# Patient Record
Sex: Female | Born: 1980 | Race: White | Hispanic: No | Smoking: Former smoker
Health system: Southern US, Community
[De-identification: ages and names within clinical notes are randomized; demographics above are authoritative.]

## PROBLEM LIST (undated history)

## (undated) ENCOUNTER — Emergency Department: Disposition: A | Discharge status: Home / Self Care | Payer: Self-pay

## (undated) DIAGNOSIS — F329 Major depressive disorder, single episode, unspecified: Secondary | ICD-10-CM

## (undated) DIAGNOSIS — G43909 Migraine, unspecified, not intractable, without status migrainosus: Secondary | ICD-10-CM

## (undated) DIAGNOSIS — F419 Anxiety disorder, unspecified: Secondary | ICD-10-CM

## (undated) DIAGNOSIS — G4700 Insomnia, unspecified: Secondary | ICD-10-CM

## (undated) DIAGNOSIS — F32A Depression, unspecified: Secondary | ICD-10-CM

## (undated) HISTORY — DX: Migraine, unspecified, not intractable, without status migrainosus: G43.909

## (undated) HISTORY — PX: INCISE AND DRAIN ABCESS: PRO64

## (undated) HISTORY — DX: Anxiety disorder, unspecified: F41.9

---

## 2008-06-10 ENCOUNTER — Emergency Department (HOSPITAL_BASED_OUTPATIENT_CLINIC_OR_DEPARTMENT_OTHER): Admission: EM | Admit: 2008-06-10 | Discharge: 2008-06-10 | Payer: Self-pay | Admitting: Emergency Medicine

## 2008-12-27 ENCOUNTER — Emergency Department (HOSPITAL_BASED_OUTPATIENT_CLINIC_OR_DEPARTMENT_OTHER): Admission: EM | Admit: 2008-12-27 | Discharge: 2008-12-27 | Payer: Self-pay | Admitting: Emergency Medicine

## 2009-06-02 ENCOUNTER — Emergency Department (HOSPITAL_BASED_OUTPATIENT_CLINIC_OR_DEPARTMENT_OTHER): Admission: EM | Admit: 2009-06-02 | Discharge: 2009-06-02 | Payer: Self-pay | Admitting: Emergency Medicine

## 2009-09-28 ENCOUNTER — Emergency Department (HOSPITAL_BASED_OUTPATIENT_CLINIC_OR_DEPARTMENT_OTHER): Admission: EM | Admit: 2009-09-28 | Discharge: 2009-09-28 | Payer: Self-pay | Admitting: Emergency Medicine

## 2009-12-25 ENCOUNTER — Emergency Department (HOSPITAL_BASED_OUTPATIENT_CLINIC_OR_DEPARTMENT_OTHER): Admission: EM | Admit: 2009-12-25 | Discharge: 2009-12-25 | Payer: Self-pay | Admitting: Emergency Medicine

## 2010-01-18 ENCOUNTER — Emergency Department (HOSPITAL_BASED_OUTPATIENT_CLINIC_OR_DEPARTMENT_OTHER): Admission: EM | Admit: 2010-01-18 | Discharge: 2010-01-18 | Payer: Self-pay | Admitting: Emergency Medicine

## 2010-01-18 ENCOUNTER — Ambulatory Visit: Payer: Self-pay | Admitting: Diagnostic Radiology

## 2010-02-04 ENCOUNTER — Emergency Department (HOSPITAL_BASED_OUTPATIENT_CLINIC_OR_DEPARTMENT_OTHER): Admission: EM | Admit: 2010-02-04 | Discharge: 2010-02-04 | Payer: Self-pay | Admitting: Emergency Medicine

## 2011-03-20 LAB — CBC
HCT: 42.6 % (ref 36.0–46.0)
Hemoglobin: 14.5 g/dL (ref 12.0–15.0)
MCHC: 34 g/dL (ref 30.0–36.0)
MCV: 88.1 fL (ref 78.0–100.0)
Platelets: 233 10*3/uL (ref 150–400)
RBC: 4.84 MIL/uL (ref 3.87–5.11)

## 2011-03-20 LAB — DIFFERENTIAL
Basophils Relative: 1 % (ref 0–1)
Eosinophils Relative: 1 % (ref 0–5)
Lymphocytes Relative: 10 % — ABNORMAL LOW (ref 12–46)
Lymphs Abs: 1.5 10*3/uL (ref 0.7–4.0)
Monocytes Relative: 3 % (ref 3–12)
Neutro Abs: 12.6 10*3/uL — ABNORMAL HIGH (ref 1.7–7.7)
Neutrophils Relative %: 85 % — ABNORMAL HIGH (ref 43–77)

## 2011-03-20 LAB — URINE MICROSCOPIC-ADD ON

## 2011-03-20 LAB — URINE CULTURE

## 2011-03-20 LAB — COMPREHENSIVE METABOLIC PANEL
Alkaline Phosphatase: 95 U/L (ref 39–117)
BUN: 8 mg/dL (ref 6–23)
CO2: 23 mEq/L (ref 19–32)
Calcium: 9.6 mg/dL (ref 8.4–10.5)
Chloride: 104 mEq/L (ref 96–112)
GFR calc Af Amer: >60 mL/min (ref >60)
GFR calc non Af Amer: >60 mL/min (ref >60)
Glucose, Bld: 71 mg/dL (ref 70–99)
Potassium: 4.7 mEq/L (ref 3.5–5.1)
Sodium: 139 mEq/L (ref 135–145)
Total Bilirubin: 0.8 mg/dL (ref 0.3–1.2)
Total Protein: 8 g/dL (ref 6.0–8.3)

## 2011-03-20 LAB — URINALYSIS, ROUTINE W REFLEX MICROSCOPIC
Bilirubin Urine: NEGATIVE
Urobilinogen, UA: 0.2 mg/dL (ref 0.0–1.0)

## 2011-03-20 LAB — POCT CARDIAC MARKERS: Myoglobin, poc: 32 ng/mL (ref 12–200)

## 2011-03-30 ENCOUNTER — Inpatient Hospital Stay (INDEPENDENT_AMBULATORY_CARE_PROVIDER_SITE_OTHER)
Admission: RE | Admit: 2011-03-30 | Discharge: 2011-03-30 | Disposition: A | Discharge status: Home / Self Care | Payer: BC Managed Care – PPO | Source: Ambulatory Visit | Attending: Family Medicine | Admitting: Family Medicine

## 2011-03-30 ENCOUNTER — Encounter: Payer: Self-pay | Admitting: Family Medicine

## 2011-03-30 ENCOUNTER — Ambulatory Visit
Admission: RE | Admit: 2011-03-30 | Discharge: 2011-03-30 | Disposition: A | Discharge status: Home / Self Care | Payer: BC Managed Care – PPO | Source: Ambulatory Visit | Attending: Family Medicine | Admitting: Family Medicine

## 2011-03-30 ENCOUNTER — Other Ambulatory Visit: Payer: Self-pay | Admitting: Family Medicine

## 2011-03-30 DIAGNOSIS — S8992XA Unspecified injury of left lower leg, initial encounter: Secondary | ICD-10-CM

## 2011-03-30 DIAGNOSIS — S82839A Other fracture of upper and lower end of unspecified fibula, initial encounter for closed fracture: Secondary | ICD-10-CM

## 2011-03-30 DIAGNOSIS — S99929A Unspecified injury of unspecified foot, initial encounter: Secondary | ICD-10-CM

## 2011-03-30 DIAGNOSIS — I10 Essential (primary) hypertension: Secondary | ICD-10-CM | POA: Insufficient documentation

## 2011-03-30 DIAGNOSIS — M25562 Pain in left knee: Secondary | ICD-10-CM

## 2011-03-30 DIAGNOSIS — S8990XA Unspecified injury of unspecified lower leg, initial encounter: Secondary | ICD-10-CM

## 2011-03-30 DIAGNOSIS — M79609 Pain in unspecified limb: Secondary | ICD-10-CM

## 2011-03-30 DIAGNOSIS — F411 Generalized anxiety disorder: Secondary | ICD-10-CM | POA: Insufficient documentation

## 2011-03-31 ENCOUNTER — Other Ambulatory Visit: Payer: BC Managed Care – PPO

## 2011-03-31 ENCOUNTER — Telehealth (INDEPENDENT_AMBULATORY_CARE_PROVIDER_SITE_OTHER): Payer: Self-pay | Admitting: Emergency Medicine

## 2011-04-02 ENCOUNTER — Telehealth (INDEPENDENT_AMBULATORY_CARE_PROVIDER_SITE_OTHER): Payer: Self-pay

## 2011-04-03 ENCOUNTER — Other Ambulatory Visit: Payer: Self-pay | Admitting: Orthopedic Surgery

## 2011-04-03 DIAGNOSIS — M25562 Pain in left knee: Secondary | ICD-10-CM

## 2011-04-08 ENCOUNTER — Other Ambulatory Visit: Payer: BC Managed Care – PPO

## 2011-04-12 ENCOUNTER — Ambulatory Visit
Admission: RE | Admit: 2011-04-12 | Discharge: 2011-04-12 | Disposition: A | Discharge status: Home / Self Care | Payer: BC Managed Care – PPO | Source: Ambulatory Visit | Attending: Orthopedic Surgery | Admitting: Orthopedic Surgery

## 2011-04-12 DIAGNOSIS — M25562 Pain in left knee: Secondary | ICD-10-CM
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
## 2011-11-13 NOTE — Telephone Encounter (Signed)
  Phone Note Outgoing Call   Call placed by: Linton Flemings RN,  April 02, 2011 3:35 PM Call placed to: Patient Summary of Call: Called [facility] pharmacy for order for Lortab 5/500mg  q4-6 hrs as needed for pain, dispense #5, pt made aware and instructed to follow up with ortho on Monday Initial call taken by: Linton Flemings RN,  April 02, 2011 3:36 PM

## 2011-11-13 NOTE — Telephone Encounter (Signed)
  Phone Note Outgoing Call   Call placed by: Linton Flemings RN,  April 02, 2011 2:38 PM Call placed to: Insurer Summary of Call: returned called made from pt.  pt was requesting more pain medication/ Pt is now being seen by orthopedist, informed Dr. Georgina Pillion and instructed pt to call ortho. on call for pain medication. Initial call taken by: Linton Flemings RN,  April 02, 2011 2:40 PM

## 2011-11-13 NOTE — Telephone Encounter (Signed)
  Phone Note Outgoing Call   Call placed by: Lavell Islam RN,  March 31, 2011 9:06 AM Call placed to: Patient Action Taken: Phone Call Completed Summary of Call: Left message on answering machine to call and let us know how appt. with ortho went, and update [place] on how she is doing. Initial call taken by: Lavell Islam RN,  March 31, 2011 9:07 AM

## 2012-01-20 ENCOUNTER — Emergency Department
Admission: EM | Admit: 2012-01-20 | Discharge: 2012-01-20 | Disposition: A | Discharge status: Home / Self Care | Payer: BC Managed Care – PPO | Source: Home / Self Care | Attending: Emergency Medicine | Admitting: Emergency Medicine

## 2012-01-20 DIAGNOSIS — J069 Acute upper respiratory infection, unspecified: Secondary | ICD-10-CM

## 2012-01-20 DIAGNOSIS — J329 Chronic sinusitis, unspecified: Secondary | ICD-10-CM

## 2012-01-20 HISTORY — DX: Depression, unspecified: F32.A

## 2012-01-20 HISTORY — DX: Major depressive disorder, single episode, unspecified: F32.9

## 2012-01-20 MED ORDER — DOXYCYCLINE HYCLATE 100 MG PO CAPS: 100.0000 mg | ORAL_CAPSULE | Freq: Two times a day (BID) | ORAL | Status: AC

## 2012-01-20 NOTE — ED Provider Notes (Addendum)
History     CSN: [id number]  Arrival date & time 01/20/12  1233   First MD Initiated Contact with Patient 01/20/12 1239      No chief complaint on file.   (Consider location/radiation/quality/duration/timing/severity/associated sxs/prior treatment) HPI Suzanne is a 31 y.o. female who complains of onset of cold symptoms for 5-6 days.  Tried Maxalt b/c thought it was a migraine, but didn't help. No sore throat No cough No pleuritic pain No wheezing No nasal congestion No post-nasal drainage + sinus pain/pressure No chest congestion No itchy/red eyes No earache No hemoptysis No SOB No chills/sweats No fever No nausea No vomiting No abdominal pain No diarrhea No skin rashes No fatigue No myalgias + headache    No past medical history on file.  No past surgical history on file.  No family history on file.  History  Substance Use Topics  . Smoking status: Not on file  . Smokeless tobacco: Not on file  . Alcohol Use: Not on file    OB History    No data available      Review of Systems  Allergies  Penicillins  Home Medications  No current outpatient prescriptions on file.  There were no vitals taken for this visit.  Physical Exam  Nursing note and vitals reviewed. Constitutional: She is oriented to person, place, and time. She appears well-developed and well-nourished.  HENT:  Head: Normocephalic and atraumatic.  Right Ear: Tympanic membrane, external ear and ear canal normal.  Left Ear: Tympanic membrane, external ear and ear canal normal.  Nose: Mucosal edema and rhinorrhea present. Right sinus exhibits maxillary sinus tenderness. Left sinus exhibits maxillary sinus tenderness.  Mouth/Throat: Posterior oropharyngeal erythema present. No oropharyngeal exudate or posterior oropharyngeal edema.  Eyes: No scleral icterus.  Neck: Neck supple.  Cardiovascular: Regular rhythm and normal heart sounds.   Pulmonary/Chest: Effort normal and breath sounds  normal. No respiratory distress.  Neurological: She is alert and oriented to person, place, and time.  Skin: Skin is warm and dry.  Psychiatric: She has a normal mood and affect. Her speech is normal.    ED Course  Procedures (including critical care time)  Labs Reviewed - No data to display No results found.   No diagnosis found.    MDM  1)  Take the prescribed antibiotic as instructed. 2)  Use nasal saline solution (over the counter) at least 3 times a day. 3)  Use over the counter decongestants like Zyrtec-D every 12 hours as needed to help with congestion.  If you have hypertension, do not take medicines with sudafed.  4)  Can take tylenol every 6 hours or motrin every 8 hours for pain or fever. 5)  Follow up with your primary doctor if no improvement in 5-7 days, sooner if increasing pain, fever, or new symptoms.     Lily Kocher, MD 01/20/12 1257  Lily Kocher, MD 01/20/12 1257

## 2012-09-07 ENCOUNTER — Emergency Department
Admission: EM | Admit: 2012-09-07 | Discharge: 2012-09-07 | Disposition: A | Discharge status: Home / Self Care | Payer: BC Managed Care – PPO | Source: Home / Self Care

## 2012-09-07 DIAGNOSIS — S058X9A Other injuries of unspecified eye and orbit, initial encounter: Secondary | ICD-10-CM

## 2012-09-07 DIAGNOSIS — S0502XA Injury of conjunctiva and corneal abrasion without foreign body, left eye, initial encounter: Secondary | ICD-10-CM

## 2012-09-07 DIAGNOSIS — G4700 Insomnia, unspecified: Secondary | ICD-10-CM | POA: Insufficient documentation

## 2012-09-07 HISTORY — DX: Insomnia, unspecified: G47.00

## 2012-09-07 MED ORDER — POLYMYXIN B-TRIMETHOPRIM 10000-0.1 UNIT/ML-% OP SOLN: 1.0000 [drp] | OPHTHALMIC | Status: DC

## 2012-09-07 MED ORDER — KETOROLAC TROMETHAMINE 0.4 % OP SOLN: 1.0000 [drp] | Freq: Four times a day (QID) | OPHTHALMIC | Status: DC

## 2012-09-07 MED ORDER — HYDROCODONE-ACETAMINOPHEN 5-500 MG PO TABS: 1.0000 | ORAL_TABLET | Freq: Four times a day (QID) | ORAL | Status: DC | PRN

## 2012-09-07 NOTE — ED Provider Notes (Signed)
History     CSN: [id number]  Arrival date & time 09/07/12  1135   None     Chief Complaint  Patient presents with  . Eye Pain    x this am    (Consider location/radiation/quality/duration/timing/severity/associated sxs/prior treatment) HPI Comments: Patient awoke this morning with a foreign body sensation in her left eye that has persisted.  She recalls no trauma to her eye.  She does not wear contacts.  Patient is a 31 y.o. female presenting with eye pain. The history is provided by the patient.  Eye Pain This is a new problem. The current episode started 3 to 5 hours ago. The problem occurs constantly. The problem has not changed since onset.Associated symptoms comments: none. Exacerbated by: blinking. Nothing relieves the symptoms. She has tried nothing for the symptoms.    Past Medical History  Diagnosis Date  . Depression   . Insomnia     History reviewed. No pertinent past surgical history.  Family History  Problem Relation Age of Onset  . Hyperlipidemia Mother   . Hypertension Mother     History  Substance Use Topics  . Smoking status: Current Every Day Smoker -- 1.0 packs/day for 15 years    Types: Cigarettes  . Smokeless tobacco: Never Used  . Alcohol Use: Yes    OB History    Grav Para Term Preterm Abortions TAB SAB Ect Mult Living                  Review of Systems  Eyes: Positive for pain.  All other systems reviewed and are negative.    Allergies  Penicillins  Home Medications   Current Outpatient Rx  Name Route Sig Dispense Refill  . ZOLPIDEM TARTRATE 10 MG PO TABS Oral Take 10 mg by mouth at bedtime as needed.    Marland Kitchen HYDROCODONE-ACETAMINOPHEN 5-500 MG PO TABS Oral Take 1 tablet by mouth every 6 (six) hours as needed for pain. 12 tablet 0  . KETOROLAC TROMETHAMINE 0.4 % OP SOLN Left Eye Place 1 drop into the left eye 4 (four) times daily. 5 mL 0  . SERTRALINE HCL 20 MG/ML PO CONC Oral Take 25 mg by mouth daily.    Marland Kitchen POLYMYXIN  B-TRIMETHOPRIM 10000-0.1 UNIT/ML-% OP SOLN Left Eye Place 1 drop into the left eye every 4 (four) hours. 10 mL 0    BP 139/98  Pulse 99  Temp 98.5 F (36.9 C) (Oral)  Resp 16  Ht 5\' 9"  (1.753 m)  Wt 135 lb (61.236 kg)  BMI 19.94 kg/m2  SpO2 99% Visual Acuity AT Visual Acuity - Bilateral Distance: 20/25 ; R Distance: 20/40 ; L Distance: 20/40  Physical Exam  Nursing note and vitals reviewed. Constitutional: She appears well-developed and well-nourished. No distress.  HENT:  Head: Atraumatic.  Nose: Nose normal.  Mouth/Throat: Oropharynx is clear and moist.  Eyes: EOM and lids are normal. Pupils are equal, round, and reactive to light. No foreign bodies found. Right eye exhibits no chemosis, no discharge and no exudate. Left eye exhibits no chemosis, no discharge, no exudate and no hordeolum. No foreign body present in the left eye. Right conjunctiva is not injected. Right conjunctiva has no hemorrhage. Left conjunctiva is injected. Left conjunctiva has no hemorrhage. No scleral icterus.         Fluorescein to the left eye reveals uptake in a faint linear abrasion as noted on diagram  Neck: Normal range of motion. Neck supple.    ED Course  Procedures  none      1. Left corneal abrasion       MDM  Begin Polytrim and Acular ophth solution.  Lortab for pain. Followup with ophthalmologist if not improved 48 hours.        Lattie Haw, MD 09/08/12 [phone]

## 2012-09-07 NOTE — ED Notes (Signed)
Cathy Wise woke this morning with pain in her left eye. After rubbing her eye it began to swell. She states her last Tetanus was more than 5 years ago.

## 2013-01-22 ENCOUNTER — Encounter: Payer: Self-pay | Admitting: *Deleted

## 2013-01-22 ENCOUNTER — Emergency Department
Admission: EM | Admit: 2013-01-22 | Discharge: 2013-01-22 | Disposition: A | Discharge status: Home / Self Care | Payer: Self-pay | Source: Home / Self Care | Attending: Family Medicine | Admitting: Family Medicine

## 2013-01-22 DIAGNOSIS — J029 Acute pharyngitis, unspecified: Secondary | ICD-10-CM

## 2013-01-22 MED ORDER — AZITHROMYCIN 250 MG PO TABS: ORAL_TABLET | ORAL | Status: DC

## 2013-01-22 NOTE — ED Provider Notes (Signed)
History     CSN: [id number]  Arrival date & time 01/22/13  0807   First MD Initiated Contact with Patient 01/22/13 [phone]      Chief Complaint  Patient presents with  . Sore Throat    HPI  SORE THROAT  Onset: 2-3 days  Description: sore throat, generalized malaise and fatigue Modifying factors: had mild URI sxs 3-4 days prior to onset of sxs.   Symptoms  Fever:  yes URI symptoms: resolved  Cough: no Headache: yes Rash:  no Swollen glands:   yes Recent Strep Exposure: yes LUQ pain: no Heartburn/brash: no Allergy Symptoms: no  Red Flags STD exposure: no Breathing difficulty: no Drooling: no Trismus: no   Past Medical History  Diagnosis Date  . Depression   . Insomnia     History reviewed. No pertinent past surgical history.  Family History  Problem Relation Age of Onset  . Hyperlipidemia Mother   . Hypertension Mother   . Heart attack Father     History  Substance Use Topics  . Smoking status: Former Smoker -- 1.00 packs/day for 15 years    Types: Cigarettes    Quit date: 09/21/2012  . Smokeless tobacco: Never Used  . Alcohol Use: Yes    OB History   Grav Para Term Preterm Abortions TAB SAB Ect Mult Living                  Review of Systems  All other systems reviewed and are negative.    Allergies  Penicillins  Home Medications   Current Outpatient Rx  Name  Route  Sig  Dispense  Refill  . azithromycin (ZITHROMAX) 250 MG tablet      Take 2 tabs PO x 1 dose, then 1 tab PO QD x 4 days   6 tablet   0   . sertraline (ZOLOFT) 20 MG/ML concentrated solution   Oral   Take 25 mg by mouth daily.         Marland Kitchen trimethoprim-polymyxin b (POLYTRIM) ophthalmic solution   Left Eye   Place 1 drop into the left eye every 4 (four) hours.   10 mL   0   . zolpidem (AMBIEN) 10 MG tablet   Oral   Take 10 mg by mouth at bedtime as needed.           BP 143/97  Pulse 95  Temp(Src) 97.5 F (36.4 C) (Oral)  Ht 5\' 8"  (1.727 m)  Wt 147 lb  (66.679 kg)  BMI 22.36 kg/m2  SpO2 100%  Physical Exam  Constitutional:  Mildly ill appearing    HENT:  Head: Normocephalic and atraumatic.  Right Ear: External ear normal.  Left Ear: External ear normal.  Mouth/Throat: Oropharyngeal exudate present.  +nasal erythema, rhinorrhea bilaterally, + post oropharyngeal erythema    Eyes: Conjunctivae are normal. Pupils are equal, round, and reactive to light.  Neck: Normal range of motion. Neck supple.  Cardiovascular: Normal rate, regular rhythm and normal heart sounds.   Pulmonary/Chest: Effort normal and breath sounds normal.  Abdominal: Soft.  Musculoskeletal: Normal range of motion.  Lymphadenopathy:    She has cervical adenopathy.  Neurological: She is alert.  Skin: Skin is warm.    ED Course  Procedures (including critical care time)  Labs Reviewed  POCT RAPID STREP A (OFFICE)   No results found.   1. Acute pharyngitis       MDM  Centor score 4. Will treat with zpak.  Monospot negative.  Strep culture.  Discussed infectious and ENT/resp red flags.  Follow up as needed.     The patient and/or caregiver has been counseled thoroughly with regard to treatment plan and/or medications prescribed including dosage, schedule, interactions, rationale for use, and possible side effects and they verbalize understanding. Diagnoses and expected course of recovery discussed and will return if not improved as expected or if the condition worsens. Patient and/or caregiver verbalized understanding.              Doree Albee, MD 01/27/13 [phone]

## 2013-01-22 NOTE — ED Notes (Signed)
Cathy Wise c/o sore throat and fatigue x yesterday.

## 2013-01-24 ENCOUNTER — Telehealth: Payer: Self-pay | Admitting: *Deleted

## 2013-06-23 DIAGNOSIS — N390 Urinary tract infection, site not specified: Secondary | ICD-10-CM | POA: Insufficient documentation

## 2014-05-29 ENCOUNTER — Encounter (HOSPITAL_BASED_OUTPATIENT_CLINIC_OR_DEPARTMENT_OTHER): Payer: Self-pay | Admitting: Emergency Medicine

## 2014-05-29 ENCOUNTER — Emergency Department (HOSPITAL_BASED_OUTPATIENT_CLINIC_OR_DEPARTMENT_OTHER)
Admission: EM | Admit: 2014-05-29 | Discharge: 2014-05-29 | Disposition: A | Discharge status: Home / Self Care | Payer: 59 | Attending: Emergency Medicine | Admitting: Emergency Medicine

## 2014-05-29 DIAGNOSIS — G4700 Insomnia, unspecified: Secondary | ICD-10-CM | POA: Insufficient documentation

## 2014-05-29 DIAGNOSIS — F329 Major depressive disorder, single episode, unspecified: Secondary | ICD-10-CM | POA: Insufficient documentation

## 2014-05-29 DIAGNOSIS — F3289 Other specified depressive episodes: Secondary | ICD-10-CM | POA: Insufficient documentation

## 2014-05-29 DIAGNOSIS — G43009 Migraine without aura, not intractable, without status migrainosus: Secondary | ICD-10-CM

## 2014-05-29 DIAGNOSIS — Z79899 Other long term (current) drug therapy: Secondary | ICD-10-CM | POA: Insufficient documentation

## 2014-05-29 DIAGNOSIS — Z880 Allergy status to penicillin: Secondary | ICD-10-CM | POA: Insufficient documentation

## 2014-05-29 DIAGNOSIS — Z87891 Personal history of nicotine dependence: Secondary | ICD-10-CM | POA: Insufficient documentation

## 2014-05-29 HISTORY — DX: Migraine, unspecified, not intractable, without status migrainosus: G43.909

## 2014-05-29 MED ORDER — METOCLOPRAMIDE HCL 5 MG/ML IJ SOLN
10.0000 mg | Freq: Once | INTRAMUSCULAR | Status: AC
  Administered 2014-05-29: 10 mg via INTRAVENOUS
  Filled 2014-05-29: qty 2

## 2014-05-29 MED ORDER — KETOROLAC TROMETHAMINE 30 MG/ML IJ SOLN
30.0000 mg | Freq: Once | INTRAMUSCULAR | Status: AC
  Administered 2014-05-29: 30 mg via INTRAVENOUS
  Filled 2014-05-29: qty 1

## 2014-05-29 MED ORDER — DIPHENHYDRAMINE HCL 50 MG/ML IJ SOLN
25.0000 mg | Freq: Once | INTRAMUSCULAR | Status: AC
  Administered 2014-05-29: 25 mg via INTRAVENOUS
  Filled 2014-05-29: qty 1

## 2014-05-29 NOTE — Discharge Instructions (Signed)

## 2014-05-29 NOTE — ED Provider Notes (Signed)
Medical screening examination/treatment/procedure(s) were performed by non-physician practitioner and as supervising physician I was immediately available for consultation/collaboration.    Terez Freimark L Aftan Vint, MD 05/29/14 2259 

## 2014-05-29 NOTE — ED Provider Notes (Signed)
CSN: [id number]     Arrival date & time 05/29/14  1449 History   First MD Initiated Contact with Patient 05/29/14 1503     Chief Complaint  Patient presents with  . Migraine     (Consider location/radiation/quality/duration/timing/severity/associated sxs/prior Treatment) Patient is a 33 y.o. female presenting with headaches. The history is provided by the patient. No language interpreter was used.  Headache Pain location:  Generalized Radiates to:  Does not radiate Onset quality:  Gradual Duration:  1 day Timing:  Constant Progression:  Worsening Similar to prior headaches: no   Relieved by:  Nothing Ineffective treatments:  None tried Associated symptoms: no vomiting   Risk factors: no anger     Past Medical History  Diagnosis Date  . Depression   . Insomnia   . Migraine    No past surgical history on file. Family History  Problem Relation Age of Onset  . Hyperlipidemia Mother   . Hypertension Mother   . Heart attack Father    History  Substance Use Topics  . Smoking status: Former Smoker -- 1.00 packs/day for 15 years    Types: Cigarettes    Quit date: 09/21/2012  . Smokeless tobacco: Never Used  . Alcohol Use: Yes   OB History   Grav Para Term Preterm Abortions TAB SAB Ect Mult Living                 Review of Systems  Gastrointestinal: Negative for vomiting.  Neurological: Positive for headaches.  All other systems reviewed and are negative.     Allergies  Penicillins  Home Medications   Prior to Admission medications   Medication Sig Start Date End Date Taking? Authorizing Provider  clonazePAM (KLONOPIN) 0.5 MG tablet Take 0.5 mg by mouth 2 (two) times daily as needed for anxiety.   Yes Historical Provider, MD  azithromycin (ZITHROMAX) 250 MG tablet Take 2 tabs PO x 1 dose, then 1 tab PO QD x 4 days 01/22/13   Doree AlbeeSteven Newton, MD  sertraline (ZOLOFT) 20 MG/ML concentrated solution Take 25 mg by mouth daily.    Historical Provider, MD   trimethoprim-polymyxin b (POLYTRIM) ophthalmic solution Place 1 drop into the left eye every 4 (four) hours. 09/07/12   Lattie HawStephen A Beese, MD  zolpidem (AMBIEN) 10 MG tablet Take 10 mg by mouth at bedtime as needed.    Historical Provider, MD   BP 153/99  Pulse 95  Temp(Src) 97.9 F (36.6 C) (Oral)  Ht 5\' 9"  (1.753 m)  Wt 135 lb (61.236 kg)  BMI 19.93 kg/m2  SpO2 100%  LMP 05/15/2014 Physical Exam  Nursing note and vitals reviewed. Constitutional: She is oriented to person, place, and time. She appears well-developed and well-nourished.  HENT:  Head: Normocephalic and atraumatic.  Right Ear: External ear normal.  Eyes: Conjunctivae and EOM are normal. Pupils are equal, round, and reactive to light.  Neck: Normal range of motion.  Cardiovascular: Normal rate and normal heart sounds.   Pulmonary/Chest: Effort normal.  Abdominal: Soft. She exhibits no distension.  Musculoskeletal: Normal range of motion.  Neurological: She is alert and oriented to person, place, and time.  Skin: Skin is warm.  Psychiatric: She has a normal mood and affect.    ED Course  Procedures (including critical care time) Labs Review Labs Reviewed - No data to display  Imaging Review No results found.   EKG Interpretation None      MDM  Pt given Iv fluids.  Reglan, torodol and  benadryl   Pt reports feeling better   Final diagnoses:  Migraine without aura and without status migrainosus, not intractable        Elson AreasLeslie K Sofia, PA-C 05/29/14 1907

## 2014-05-29 NOTE — ED Notes (Signed)
Pt.reports history of migranes and has one at present time with nausea.  No vomiting, no diarrhea.  Pt. Does reports feeling dizzy at times,  Neuro test WNL in triage.

## 2014-12-09 ENCOUNTER — Emergency Department (HOSPITAL_BASED_OUTPATIENT_CLINIC_OR_DEPARTMENT_OTHER)
Admission: EM | Admit: 2014-12-09 | Discharge: 2014-12-09 | Disposition: A | Discharge status: Home / Self Care | Payer: 59 | Attending: Emergency Medicine | Admitting: Emergency Medicine

## 2014-12-09 ENCOUNTER — Encounter (HOSPITAL_BASED_OUTPATIENT_CLINIC_OR_DEPARTMENT_OTHER): Payer: Self-pay | Admitting: Emergency Medicine

## 2014-12-09 DIAGNOSIS — F329 Major depressive disorder, single episode, unspecified: Secondary | ICD-10-CM | POA: Diagnosis not present

## 2014-12-09 DIAGNOSIS — Z880 Allergy status to penicillin: Secondary | ICD-10-CM | POA: Insufficient documentation

## 2014-12-09 DIAGNOSIS — G43009 Migraine without aura, not intractable, without status migrainosus: Secondary | ICD-10-CM

## 2014-12-09 DIAGNOSIS — Z87891 Personal history of nicotine dependence: Secondary | ICD-10-CM | POA: Diagnosis not present

## 2014-12-09 DIAGNOSIS — R51 Headache: Secondary | ICD-10-CM | POA: Diagnosis present

## 2014-12-09 DIAGNOSIS — G4700 Insomnia, unspecified: Secondary | ICD-10-CM | POA: Diagnosis not present

## 2014-12-09 DIAGNOSIS — G43909 Migraine, unspecified, not intractable, without status migrainosus: Secondary | ICD-10-CM | POA: Diagnosis not present

## 2014-12-09 DIAGNOSIS — R112 Nausea with vomiting, unspecified: Secondary | ICD-10-CM

## 2014-12-09 DIAGNOSIS — R197 Diarrhea, unspecified: Secondary | ICD-10-CM | POA: Insufficient documentation

## 2014-12-09 DIAGNOSIS — R000 Tachycardia, unspecified: Secondary | ICD-10-CM | POA: Insufficient documentation

## 2014-12-09 DIAGNOSIS — Z79899 Other long term (current) drug therapy: Secondary | ICD-10-CM | POA: Diagnosis not present

## 2014-12-09 MED ORDER — DEXAMETHASONE SODIUM PHOSPHATE 10 MG/ML IJ SOLN
10.0000 mg | Freq: Once | INTRAMUSCULAR | Status: AC
  Administered 2014-12-09: 10 mg via INTRAVENOUS
  Filled 2014-12-09: qty 1

## 2014-12-09 MED ORDER — SODIUM CHLORIDE 0.9 % IV BOLUS (SEPSIS)
1000.0000 mL | Freq: Once | INTRAVENOUS | Status: AC
  Administered 2014-12-09: 1000 mL via INTRAVENOUS

## 2014-12-09 MED ORDER — METOCLOPRAMIDE HCL 5 MG/ML IJ SOLN
10.0000 mg | Freq: Once | INTRAMUSCULAR | Status: AC
  Administered 2014-12-09: 10 mg via INTRAVENOUS
  Filled 2014-12-09: qty 2

## 2014-12-09 MED ORDER — PROMETHAZINE HCL 25 MG PO TABS: 25.0000 mg | ORAL_TABLET | Freq: Four times a day (QID) | ORAL | Status: AC | PRN

## 2014-12-09 MED ORDER — DIPHENHYDRAMINE HCL 50 MG/ML IJ SOLN
50.0000 mg | Freq: Once | INTRAMUSCULAR | Status: AC
  Administered 2014-12-09: 50 mg via INTRAVENOUS
  Filled 2014-12-09: qty 1

## 2014-12-09 MED ORDER — KETOROLAC TROMETHAMINE 30 MG/ML IJ SOLN
30.0000 mg | Freq: Once | INTRAMUSCULAR | Status: AC
  Administered 2014-12-09: 30 mg via INTRAVENOUS
  Filled 2014-12-09: qty 1

## 2014-12-09 MED ORDER — BUTALBITAL-APAP-CAFFEINE 50-325-40 MG PO TABS: 1.0000 | ORAL_TABLET | Freq: Four times a day (QID) | ORAL | Status: AC | PRN

## 2014-12-09 NOTE — Discharge Instructions (Signed)
Migraine Headache A migraine headache is an intense, throbbing pain on one or both sides of your head. A migraine can last for 30 minutes to several hours. CAUSES  The exact cause of a migraine headache is not always known. However, a migraine may be caused when nerves in the brain become irritated and release chemicals that cause inflammation. This causes pain. Certain things may also trigger migraines, such as:  Alcohol.  Smoking.  Stress.  Menstruation.  Aged cheeses.  Foods or drinks that contain nitrates, glutamate, aspartame, or tyramine.  Lack of sleep.  Chocolate.  Caffeine.  Hunger.  Physical exertion.  Fatigue.  Medicines used to treat chest pain (nitroglycerine), birth control pills, estrogen, and some blood pressure medicines. SIGNS AND SYMPTOMS  Pain on one or both sides of your head.  Pulsating or throbbing pain.  Severe pain that prevents daily activities.  Pain that is aggravated by any physical activity.  Nausea, vomiting, or both.  Dizziness.  Pain with exposure to bright lights, loud noises, or activity.  General sensitivity to bright lights, loud noises, or smells. Before you get a migraine, you may get warning signs that a migraine is coming (aura). An aura may include:  Seeing flashing lights.  Seeing bright spots, halos, or zigzag lines.  Having tunnel vision or blurred vision.  Having feelings of numbness or tingling.  Having trouble talking.  Having muscle weakness. DIAGNOSIS  A migraine headache is often diagnosed based on:  Symptoms.  Physical exam.  A CT scan or MRI of your head. These imaging tests cannot diagnose migraines, but they can help rule out other causes of headaches. TREATMENT Medicines may be given for pain and nausea. Medicines can also be given to help prevent recurrent migraines.  HOME CARE INSTRUCTIONS  Only take over-the-counter or prescription medicines for pain or discomfort as directed by your  health care provider. The use of long-term narcotics is not recommended.  Lie down in a dark, quiet room when you have a migraine.  Keep a journal to find out what may trigger your migraine headaches. For example, write down:  What you eat and drink.  How much sleep you get.  Any change to your diet or medicines.  Limit alcohol consumption.  Quit smoking if you smoke.  Get 7-9 hours of sleep, or as recommended by your health care provider.  Limit stress.  Keep lights dim if bright lights bother you and make your migraines worse. SEEK IMMEDIATE MEDICAL CARE IF:   Your migraine becomes severe.  You have a fever.  You have a stiff neck.  You have vision loss.  You have muscular weakness or loss of muscle control.  You start losing your balance or have trouble walking.  You feel faint or pass out.  You have severe symptoms that are different from your first symptoms. MAKE SURE YOU:   Understand these instructions.  Will watch your condition.  Will get help right away if you are not doing well or get worse. Document Released: 11/27/2005 Document Revised: 04/13/2014 Document Reviewed: 08/04/2013 [facility] Patient Information 2015 [place], [place]. This information is not intended to replace advice given to you by your health care provider. Make sure you discuss any questions you have with your health care provider.  Nausea and Vomiting Nausea is a sick feeling that often comes before throwing up (vomiting). Vomiting is a reflex where stomach contents come out of your mouth. Vomiting can cause severe loss of body fluids (dehydration). Children and elderly  adults can become dehydrated quickly, especially if they also have diarrhea. Nausea and vomiting are symptoms of a condition or disease. It is important to find the cause of your symptoms. CAUSES   Direct irritation of the stomach lining. This irritation can result from increased acid production (gastroesophageal reflux  disease), infection, food poisoning, taking certain medicines (such as nonsteroidal anti-inflammatory drugs), alcohol use, or tobacco use.  Signals from the brain.These signals could be caused by a headache, heat exposure, an inner ear disturbance, increased pressure in the brain from injury, infection, a tumor, or a concussion, pain, emotional stimulus, or metabolic problems.  An obstruction in the gastrointestinal tract (bowel obstruction).  Illnesses such as diabetes, hepatitis, gallbladder problems, appendicitis, kidney problems, cancer, sepsis, atypical symptoms of a heart attack, or eating disorders.  Medical treatments such as chemotherapy and radiation.  Receiving medicine that makes you sleep (general anesthetic) during surgery. DIAGNOSIS Your caregiver may ask for tests to be done if the problems do not improve after a few days. Tests may also be done if symptoms are severe or if the reason for the nausea and vomiting is not clear. Tests may include:  Urine tests.  Blood tests.  Stool tests.  Cultures (to look for evidence of infection).  X-rays or other imaging studies. Test results can help your caregiver make decisions about treatment or the need for additional tests. TREATMENT You need to stay well hydrated. Drink frequently but in small amounts.You may wish to drink water, sports drinks, clear broth, or eat frozen ice pops or gelatin dessert to help stay hydrated.When you eat, eating slowly may help prevent nausea.There are also some antinausea medicines that may help prevent nausea. HOME CARE INSTRUCTIONS   Take all medicine as directed by your caregiver.  If you do not have an appetite, do not force yourself to eat. However, you must continue to drink fluids.  If you have an appetite, eat a normal diet unless your caregiver tells you differently.  Eat a variety of complex carbohydrates (rice, wheat, potatoes, bread), lean meats, yogurt, fruits, and  vegetables.  Avoid high-fat foods because they are more difficult to digest.  Drink enough water and fluids to keep your urine clear or pale yellow.  If you are dehydrated, ask your caregiver for specific rehydration instructions. Signs of dehydration may include:  Severe thirst.  Dry lips and mouth.  Dizziness.  Dark urine.  Decreasing urine frequency and amount.  Confusion.  Rapid breathing or pulse. SEEK IMMEDIATE MEDICAL CARE IF:   You have blood or brown flecks (like coffee grounds) in your vomit.  You have black or bloody stools.  You have a severe headache or stiff neck.  You are confused.  You have severe abdominal pain.  You have chest pain or trouble breathing.  You do not urinate at least once every 8 hours.  You develop cold or clammy skin.  You continue to vomit for longer than 24 to 48 hours.  You have a fever. MAKE SURE YOU:   Understand these instructions.  Will watch your condition.  Will get help right away if you are not doing well or get worse. Document Released: 11/27/2005 Document Revised: 02/19/2012 Document Reviewed: 04/26/2011 [facility] Patient Information 2015 [place], [place]. This information is not intended to replace advice given to you by your health care provider. Make sure you discuss any questions you have with your health care provider.  Diarrhea Diarrhea is frequent loose and watery bowel movements. It can cause you  to feel weak and dehydrated. Dehydration can cause you to become tired and thirsty, have a dry mouth, and have decreased urination that often is dark yellow. Diarrhea is a sign of another problem, most often an infection that will not last long. In most cases, diarrhea typically lasts 2-3 days. However, it can last longer if it is a sign of something more serious. It is important to treat your diarrhea as directed by your caregiver to lessen or prevent future episodes of diarrhea. CAUSES  Some common causes  include:  Gastrointestinal infections caused by viruses, bacteria, or parasites.  Food poisoning or food allergies.  Certain medicines, such as antibiotics, chemotherapy, and laxatives.  Artificial sweeteners and fructose.  Digestive disorders. HOME CARE INSTRUCTIONS  Ensure adequate fluid intake (hydration): Have 1 cup (8 oz) of fluid for each diarrhea episode. Avoid fluids that contain simple sugars or sports drinks, fruit juices, whole milk products, and sodas. Your urine should be clear or pale yellow if you are drinking enough fluids. Hydrate with an oral rehydration solution that you can purchase at pharmacies, retail stores, and online. You can prepare an oral rehydration solution at home by mixing the following ingredients together:   - tsp table salt.   tsp baking soda.   tsp salt substitute containing potassium chloride.  1  tablespoons sugar.  1 L (34 oz) of water.  Certain foods and beverages may increase the speed at which food moves through the gastrointestinal (GI) tract. These foods and beverages should be avoided and include:  Caffeinated and alcoholic beverages.  High-fiber foods, such as raw fruits and vegetables, nuts, seeds, and whole grain breads and cereals.  Foods and beverages sweetened with sugar alcohols, such as xylitol, sorbitol, and mannitol.  Some foods may be well tolerated and may help thicken stool including:  Starchy foods, such as rice, toast, pasta, low-sugar cereal, oatmeal, grits, baked potatoes, crackers, and bagels.  Bananas.  Applesauce.  Add probiotic-rich foods to help increase healthy bacteria in the GI tract, such as yogurt and fermented milk products.  Wash your hands well after each diarrhea episode.  Only take over-the-counter or prescription medicines as directed by your caregiver.  Take a warm bath to relieve any burning or pain from frequent diarrhea episodes. SEEK IMMEDIATE MEDICAL CARE IF:   You are unable to  keep fluids down.  You have persistent vomiting.  You have blood in your stool, or your stools are black and tarry.  You do not urinate in 6-8 hours, or there is only a small amount of very dark urine.  You have abdominal pain that increases or localizes.  You have weakness, dizziness, confusion, or light-headedness.  You have a severe headache.  Your diarrhea gets worse or does not get better.  You have a fever or persistent symptoms for more than 2-3 days.  You have a fever and your symptoms suddenly get worse. MAKE SURE YOU:   Understand these instructions.  Will watch your condition.  Will get help right away if you are not doing well or get worse. Document Released: 11/17/2002 Document Revised: 04/13/2014 Document Reviewed: 08/04/2012 [facility] Patient Information 2015 [place], [place]. This information is not intended to replace advice given to you by your health care provider. Make sure you discuss any questions you have with your health care provider.

## 2014-12-09 NOTE — ED Notes (Signed)
Headache since Monday.  Pt states she has migraines which this is typical of her migraines.  Some N/V/D.  No known fever.

## 2014-12-09 NOTE — ED Provider Notes (Signed)
TIME SEEN: 8:10 AM  CHIEF COMPLAINT: Migraine headache, nausea, vomiting, diarrhea   HPI: Pt is a 33 y.o. F with history of depression, migraine headaches who presents to the emergency department with complaints of a migraine headache that started on Monday 2 days ago. States it is behind her right eye and is throbbing in nature associated with photophobia, nausea and vomiting. States is typical of her migraine headaches. Patient takes Relpax at home and has used this for 4 times without any relief of her headache. She states that she has not had any head injury, fevers or chills, numbness, tingling or focal weakness. She does state that she is also had intermittent diarrhea that has improved. She states no more than 3-4 episodes of vomiting a day and 1-2 episodes of nonbloody diarrhea in a day. No sick contacts or recent travel. No abdominal pain.  ROS: See HPI Constitutional: no fever  Eyes: no drainage  ENT: no runny nose   Cardiovascular:  no chest pain  Resp: no SOB  GI: vomiting GU: no dysuria Integumentary: no rash  Allergy: no hives  Musculoskeletal: no leg swelling  Neurological: no slurred speech ROS otherwise negative  PAST MEDICAL HISTORY/PAST SURGICAL HISTORY:  Past Medical History  Diagnosis Date  . Depression   . Insomnia   . Migraine     MEDICATIONS:  Prior to Admission medications   Medication Sig Start Date End Date Taking? Authorizing Provider  eletriptan (RELPAX) 20 MG tablet Take 20 mg by mouth as needed for migraine or headache. One tablet by mouth at onset of headache. May repeat in 2 hours if headache persists or recurs.   Yes Historical Provider, MD  levonorgestrel (MIRENA) 20 MCG/24HR IUD 1 each by Intrauterine route once.   Yes Historical Provider, MD  lisinopril (PRINIVIL,ZESTRIL) 2.5 MG tablet Take 2.5 mg by mouth daily.   Yes Historical Provider, MD  UNKNOWN TO PATIENT Muscle relaxant   Yes Historical Provider, MD  clonazePAM (KLONOPIN) 0.5 MG tablet  Take 0.5 mg by mouth 2 (two) times daily as needed for anxiety.    Historical Provider, MD  sertraline (ZOLOFT) 20 MG/ML concentrated solution Take 25 mg by mouth daily.    Historical Provider, MD  zolpidem (AMBIEN) 10 MG tablet Take 10 mg by mouth at bedtime as needed.    Historical Provider, MD    ALLERGIES:  Allergies  Allergen Reactions  . Penicillins     SOCIAL HISTORY:  History  Substance Use Topics  . Smoking status: Former Smoker -- 1.00 packs/day for 15 years    Types: Cigarettes    Quit date: 09/21/2012  . Smokeless tobacco: Never Used  . Alcohol Use: Yes    FAMILY HISTORY: Family History  Problem Relation Age of Onset  . Hyperlipidemia Mother   . Hypertension Mother   . Heart attack Father     EXAM: BP 133/116 mmHg  Pulse 110  Temp(Src) 98.1 F (36.7 C) (Oral)  Resp 16  Ht 5\' 9"  (1.753 m)  Wt 130 lb (58.968 kg)  BMI 19.19 kg/m2  SpO2 100% CONSTITUTIONAL: Alert and oriented and responds appropriately to questions. Well-appearing; well-nourished HEAD: Normocephalic EYES: Conjunctivae clear, PERRL; photophobia is present ENT: normal nose; no rhinorrhea; moist mucous membranes; pharynx without lesions noted NECK: Supple, no meningismus, no LAD  CARD: Regular and tachycardic; S1 and S2 appreciated; no murmurs, no clicks, no rubs, no gallops RESP: Normal chest excursion without splinting or tachypnea; breath sounds clear and equal bilaterally; no wheezes, no  rhonchi, no rales, no hypoxia ABD/GI: Normal bowel sounds; non-distended; soft, non-tender, no rebound, no guarding BACK:  The back appears normal and is non-tender to palpation, there is no CVA tenderness EXT: Normal ROM in all joints; non-tender to palpation; no edema; normal capillary refill; no cyanosis    SKIN: Normal color for age and race; warm NEURO: Moves all extremities equally; sensation to light touch intact diffusely, cranial nerves II through XII intact PSYCH: The patient's mood and manner are  appropriate. Grooming and personal hygiene are appropriate.  MEDICAL DECISION MAKING: Patient here with her typical migraine headache that is also having increased vomiting and diarrhea. They have a viral gastroenteritis. States that her diarrhea has her knee improved and is now gone. Her abdominal exam is completely benign. We'll treat migraine with Toradol, Benadryl, Reglan as this is helped her in the past. We'll also give IV fluids and closely monitor. I do not feel she needs abdominal imaging at this time as her abdominal exam is very benign.    ED PROGRESS: 9:30 AM  Pt's headache is now at 2/10 down from a 7/10. Heart rate and blood pressure have improved. She is able to tolerate by mouth. Reports feeling much better. Photophobia now gone. She feel she is ready for discharge home. We'll discharge with prescription for Fioricet and Phenergan to use as needed for headache and nausea at home in case her Relpax does not help. Discussed return precautions. She verbalized understanding and is comfortable with plan.     Layla MawKristen N Ward, DO 12/09/14 0930

## 2015-02-23 ENCOUNTER — Emergency Department (HOSPITAL_BASED_OUTPATIENT_CLINIC_OR_DEPARTMENT_OTHER)
Admission: EM | Admit: 2015-02-23 | Discharge: 2015-02-23 | Disposition: A | Discharge status: Home / Self Care | Payer: 59 | Attending: Emergency Medicine | Admitting: Emergency Medicine

## 2015-02-23 ENCOUNTER — Encounter (HOSPITAL_BASED_OUTPATIENT_CLINIC_OR_DEPARTMENT_OTHER): Payer: Self-pay | Admitting: *Deleted

## 2015-02-23 DIAGNOSIS — Z880 Allergy status to penicillin: Secondary | ICD-10-CM | POA: Insufficient documentation

## 2015-02-23 DIAGNOSIS — F329 Major depressive disorder, single episode, unspecified: Secondary | ICD-10-CM | POA: Insufficient documentation

## 2015-02-23 DIAGNOSIS — G43909 Migraine, unspecified, not intractable, without status migrainosus: Secondary | ICD-10-CM | POA: Insufficient documentation

## 2015-02-23 DIAGNOSIS — Z79899 Other long term (current) drug therapy: Secondary | ICD-10-CM | POA: Insufficient documentation

## 2015-02-23 DIAGNOSIS — Z87891 Personal history of nicotine dependence: Secondary | ICD-10-CM | POA: Insufficient documentation

## 2015-02-23 MED ORDER — DIPHENHYDRAMINE HCL 50 MG/ML IJ SOLN
25.0000 mg | Freq: Once | INTRAMUSCULAR | Status: AC
  Administered 2015-02-23: 25 mg via INTRAVENOUS
  Filled 2015-02-23: qty 1

## 2015-02-23 MED ORDER — METOCLOPRAMIDE HCL 5 MG/ML IJ SOLN
10.0000 mg | Freq: Once | INTRAMUSCULAR | Status: AC
  Administered 2015-02-23: 10 mg via INTRAVENOUS
  Filled 2015-02-23: qty 2

## 2015-02-23 MED ORDER — KETOROLAC TROMETHAMINE 30 MG/ML IJ SOLN
30.0000 mg | Freq: Once | INTRAMUSCULAR | Status: AC
  Administered 2015-02-23: 30 mg via INTRAVENOUS
  Filled 2015-02-23: qty 1

## 2015-02-23 MED ORDER — SODIUM CHLORIDE 0.9 % IV BOLUS (SEPSIS)
500.0000 mL | Freq: Once | INTRAVENOUS | Status: AC
  Administered 2015-02-23: 500 mL via INTRAVENOUS

## 2015-02-23 NOTE — Discharge Instructions (Signed)
Please read and follow all provided instructions.  Your diagnoses today include:  1. Migraine without status migrainosus, not intractable, unspecified migraine type    Tests performed today include:  Vital signs. See below for your results today.   Medications:  In the Emergency Department you received:  Reglan - antinausea/headache medication  Benadryl - antihistamine to counteract potential side effects of reglan  Toradol - NSAID medication similar to ibuprofen  Take any prescribed medications only as directed.  Additional information:  Follow any educational materials contained in this packet.  You are having a headache. No specific cause was found today for your headache. It may have been a migraine or other cause of headache. Stress, anxiety, fatigue, and depression are common triggers for headaches.   Your headache today does not appear to be life-threatening or require hospitalization, but often the exact cause of headaches is not determined in the emergency department. Therefore, follow-up with your doctor is very important to find out what may have caused your headache and whether or not you need any further diagnostic testing or treatment.   Sometimes headaches can appear benign (not harmful), but then more serious symptoms can develop which should prompt an immediate re-evaluation by your doctor or the emergency department.  BE VERY CAREFUL not to take multiple medicines containing Tylenol (also called acetaminophen). Doing so can lead to an overdose which can damage your liver and cause liver failure and possibly death.   Follow-up instructions: Please follow-up with your primary care provider in the next 3 days for further evaluation of your symptoms.   Return instructions:   Please return to the Emergency Department if you experience worsening symptoms.  Return if the medications do not resolve your headache, if it recurs, or if you have multiple episodes of  vomiting or cannot keep down fluids.  Return if you have a change from the usual headache.  RETURN IMMEDIATELY IF you:  Develop a sudden, severe headache  Develop confusion or become poorly responsive or faint  Develop a fever above 100.69F or problem breathing  Have a change in speech, vision, swallowing, or understanding  Develop new weakness, numbness, tingling, incoordination in your arms or legs  Have a seizure  Please return if you have any other emergent concerns.  Additional Information:  Your vital signs today were: BP 111/73 mmHg   Pulse 102   Temp(Src) 98.2 F (36.8 C) (Oral)   Resp 20   Ht 5\' 9"  (1.753 m)   Wt 130 lb (58.968 kg)   BMI 19.19 kg/m2   SpO2 100% If your blood pressure (BP) was elevated above 135/85 this visit, please have this repeated by your doctor within one month. --------------

## 2015-02-23 NOTE — ED Provider Notes (Signed)
CSN: [id number]     Arrival date & time 02/23/15  1118 History   First MD Initiated Contact with Patient 02/23/15 1217     Chief Complaint  Patient presents with  . Migraine    (Consider location/radiation/quality/duration/timing/severity/associated sxs/prior Treatment) HPI Comments: Patients with history of migraine headaches presents with her typical migraine headache. Two nights ago patient developed pain around her left eye with associated photophobia, nausea and vomiting. She has had this exact same symptoms in the past and describes it as "run-of-the-mill". Patient yesterday took Relpax and another medication that her neurologist prescribed for headache. This helped temporarily but headache returned. Patient denies signs of stroke including: facial droop, slurred speech, aphasia, weakness/numbness in extremities, imbalance/trouble walking. No head injury. No fever or neck stiffness. The onset of this condition was acute. The course is constant.     Patient is a 34 y.o. female presenting with migraines. The history is provided by the patient and medical records.  Migraine Associated symptoms include headaches, nausea and vomiting. Pertinent negatives include no chest pain, congestion, fever, neck pain, numbness, rash or weakness.    Past Medical History  Diagnosis Date  . Depression   . Insomnia   . Migraine    History reviewed. No pertinent past surgical history. Family History  Problem Relation Age of Onset  . Hyperlipidemia Mother   . Hypertension Mother   . Heart attack Father    History  Substance Use Topics  . Smoking status: Former Smoker -- 1.00 packs/day for 15 years    Types: Cigarettes    Quit date: 09/21/2012  . Smokeless tobacco: Never Used  . Alcohol Use: Yes   OB History    No data available     Review of Systems  Constitutional: Negative for fever.  HENT: Negative for congestion, dental problem, rhinorrhea and sinus pressure.   Eyes: Positive for  photophobia. Negative for discharge, redness and visual disturbance.  Respiratory: Negative for shortness of breath.   Cardiovascular: Negative for chest pain.  Gastrointestinal: Positive for nausea and vomiting.  Musculoskeletal: Negative for gait problem, neck pain and neck stiffness.  Skin: Negative for rash.  Neurological: Positive for headaches. Negative for syncope, speech difficulty, weakness, light-headedness and numbness.  Psychiatric/Behavioral: Negative for confusion.    Allergies  Penicillins  Home Medications   Prior to Admission medications   Medication Sig Start Date End Date Taking? Authorizing Provider  TRAZODONE HCL PO Take by mouth.   Yes Historical Provider, MD  butalbital-acetaminophen-caffeine (FIORICET) 50-325-40 MG per tablet Take 1-2 tablets by mouth every 6 (six) hours as needed for headache. 12/09/14 12/09/15  Kristen N Ward, DO  clonazePAM (KLONOPIN) 0.5 MG tablet Take 0.5 mg by mouth 2 (two) times daily as needed for anxiety.    Historical Provider, MD  eletriptan (RELPAX) 20 MG tablet Take 20 mg by mouth as needed for migraine or headache. One tablet by mouth at onset of headache. May repeat in 2 hours if headache persists or recurs.    Historical Provider, MD  levonorgestrel (MIRENA) 20 MCG/24HR IUD 1 each by Intrauterine route once.    Historical Provider, MD  lisinopril (PRINIVIL,ZESTRIL) 2.5 MG tablet Take 2.5 mg by mouth daily.    Historical Provider, MD  promethazine (PHENERGAN) 25 MG tablet Take 1 tablet (25 mg total) by mouth every 6 (six) hours as needed for nausea or vomiting. 12/09/14   Kristen N Ward, DO  sertraline (ZOLOFT) 20 MG/ML concentrated solution Take 25 mg by mouth daily.  Historical Provider, MD  UNKNOWN TO PATIENT Muscle relaxant    Historical Provider, MD  zolpidem (AMBIEN) 10 MG tablet Take 10 mg by mouth at bedtime as needed.    Historical Provider, MD   BP 111/73 mmHg  Pulse 102  Temp(Src) 98.2 F (36.8 C) (Oral)  Resp 20   Ht  (1.753 m)  Wt 130 lb (58.968 kg)  BMI 19.19 kg/m2  SpO2 100%   Physical Exam  Constitutional: She is oriented to person, place, and time. She appears well-developed and well-nourished.  HENT:  Head: Normocephalic and atraumatic.  Right Ear: Tympanic membrane, external ear and ear canal normal.  Left Ear: Tympanic membrane, external ear and ear canal normal.  Nose: Nose normal.  Mouth/Throat: Uvula is midline, oropharynx is clear and moist and mucous membranes are normal.  Eyes: Conjunctivae, EOM and lids are normal. Pupils are equal, round, and reactive to light. Right eye exhibits no nystagmus. Left eye exhibits no nystagmus.  Neck: Normal range of motion. Neck supple.  Cardiovascular: Normal rate and regular rhythm.   Pulmonary/Chest: Effort normal and breath sounds normal.  Abdominal: Soft. There is no tenderness.  Musculoskeletal:       Cervical back: She exhibits normal range of motion, no tenderness and no bony tenderness.  Neurological: She is alert and oriented to person, place, and time. She has normal strength and normal reflexes. No cranial nerve deficit or sensory deficit. She displays a negative Romberg sign. Coordination and gait normal. GCS eye subscore is 4. GCS verbal subscore is 5. GCS motor subscore is 6.  Skin: Skin is warm and dry.  Psychiatric: She has a normal mood and affect.  Nursing note and vitals reviewed.   ED Course  Procedures (including critical care time) Labs Review Labs Reviewed - No data to display  Imaging Review No results found.   EKG Interpretation None       12:54 PM Patient seen and examined. Work-up initiated. Medications ordered. Seems to be her typical headache symptoms without deviation. No neuro deficits. Anticipate treatment with migraine cocktail and discharge to home.   Vital signs reviewed and are as follows: BP 111/73 mmHg  Pulse 102  Temp(Src) 98.2 F (36.8 C) (Oral)  Resp 20  Ht  (1.753 m)  Wt 130 lb  (58.968 kg)  BMI 19.19 kg/m2  SpO2 100%  1:36 PM Patient is feeling much better and is ready to go home.   Will d/c to home.   Patient urged to return with worsening symptoms or other concerns. Patient verbalized understanding and agrees with plan.    MDM   Final diagnoses:  Migraine without status migrainosus, not intractable, unspecified migraine type   Patient without high-risk features of headache including: sudden onset/thunderclap HA, no similar headache in past, altered mental status, accompanying seizure, headache with exertion, age > 6, history of immunocompromise, neck or shoulder pain, fever, use of anticoagulation, family history of spontaneous SAH, concomitant drug use, toxic exposure.   Patient has a normal complete neurological exam, normal vital signs, normal level of consciousness, no signs of meningismus, is well-appearing/non-toxic appearing, no signs of trauma.   Imaging with CT/MRI not indicated given history and physical exam findings.   No dangerous or life-threatening conditions suspected or identified by history, physical exam, and by work-up. No indications for hospitalization identified.      Renne Crigler, PA-C 02/23/15 1339  Layla Maw Ward, DO 02/23/15 1443

## 2015-02-23 NOTE — ED Notes (Signed)
Headache x 2 days. Light sensitive. Nausea.  

## 2015-03-10 ENCOUNTER — Emergency Department (INDEPENDENT_AMBULATORY_CARE_PROVIDER_SITE_OTHER): Payer: 59

## 2015-03-10 ENCOUNTER — Emergency Department
Admission: EM | Admit: 2015-03-10 | Discharge: 2015-03-10 | Disposition: A | Discharge status: Home / Self Care | Payer: 59 | Source: Home / Self Care | Attending: Family Medicine | Admitting: Family Medicine

## 2015-03-10 ENCOUNTER — Encounter: Payer: Self-pay | Admitting: *Deleted

## 2015-03-10 DIAGNOSIS — R05 Cough: Secondary | ICD-10-CM | POA: Diagnosis not present

## 2015-03-10 DIAGNOSIS — R053 Chronic cough: Secondary | ICD-10-CM

## 2015-03-10 DIAGNOSIS — J020 Streptococcal pharyngitis: Secondary | ICD-10-CM | POA: Diagnosis not present

## 2015-03-10 LAB — POCT CBC W AUTO DIFF (K'VILLE URGENT CARE)

## 2015-03-10 MED ORDER — HYDROCODONE-HOMATROPINE 5-1.5 MG/5ML PO SYRP: ORAL_SOLUTION | ORAL | Status: DC

## 2015-03-10 MED ORDER — CLINDAMYCIN HCL 300 MG PO CAPS: 300.0000 mg | ORAL_CAPSULE | Freq: Three times a day (TID) | ORAL | Status: AC

## 2015-03-10 NOTE — ED Notes (Signed)
Cathy Wise was diagnosed with strep and flu last Tuesday at her PCP. She has completed Tamiflu and still taking keflex. She reports that she still has a sore throat, cough and fatigue.

## 2015-03-10 NOTE — Discharge Instructions (Signed)
Take plain guaifenesin (1200mg  extended release tabs such as Mucinex) twice daily, with plenty of water, for cough and congestion.  Get adequate rest.   May use Afrin nasal spray (or generic oxymetazoline) twice daily for about 5 days.  Also recommend using saline nasal spray several times daily and saline nasal irrigation (AYR is a common brand).   Try warm salt water gargles for sore throat.  Stop all antihistamines for now, and other non-prescription cough/cold preparations. Stop Keflex Follow-up with family doctor if not improving about one week.

## 2015-03-10 NOTE — ED Provider Notes (Signed)
CSN: [id number]     Arrival date & time 03/10/15  1342 History   First MD Initiated Contact with Patient 03/10/15 [phone]     Chief Complaint  Patient presents with  . Cough      HPI Comments: Patient reports that she developed flu-like symptoms 8 days ago with fatigue, cough, myalgias, and sore throat.  She was diagnosed with the flu and started on Tamiflu.  Her initial rapid strep test was positive, but her throat culture came back positive and she was started on Keflex.  She complains of persistent sore throat, fatigue, and cough.  The history is provided by the patient.    Past Medical History  Diagnosis Date  . Depression   . Insomnia   . Migraine    History reviewed. No pertinent past surgical history. Family History  Problem Relation Age of Onset  . Hyperlipidemia Mother   . Hypertension Mother   . Heart attack Father    History  Substance Use Topics  . Smoking status: Former Smoker -- 1.00 packs/day for 15 years    Types: Cigarettes    Quit date: 09/21/2012  . Smokeless tobacco: Never Used  . Alcohol Use: Yes   OB History    No data available     Review of Systems + sore throat + hoarse + cough + sneezing No pleuritic pain but has tightness in anterior chest + wheezing + nasal congestion + post-nasal drainage No sinus pain/pressure No itchy/red eyes No earache No hemoptysis No SOB No fever/chills + nausea No vomiting No abdominal pain No diarrhea No urinary symptoms No skin rash + fatigue + myalgias + headache Used OTC meds without relief  Allergies  Penicillins  Home Medications   Prior to Admission medications   Medication Sig Start Date End Date Taking? Authorizing Provider  butalbital-acetaminophen-caffeine (FIORICET) 50-325-40 MG per tablet Take 1-2 tablets by mouth every 6 (six) hours as needed for headache. 12/09/14 12/09/15  Kristen N Ward, DO  clindamycin (CLEOCIN) 300 MG capsule Take 1 capsule (300 mg total) by mouth 3 (three) times  daily. 03/10/15   Lattie Haw, MD  clonazePAM (KLONOPIN) 0.5 MG tablet Take 0.5 mg by mouth 2 (two) times daily as needed for anxiety.    Historical Provider, MD  eletriptan (RELPAX) 20 MG tablet Take 20 mg by mouth as needed for migraine or headache. One tablet by mouth at onset of headache. May repeat in 2 hours if headache persists or recurs.    Historical Provider, MD  HYDROcodone-homatropine [facility]) 5-1.5 MG/5ML syrup Take 5mL PO HS prn cough 03/10/15   Lattie Haw, MD  levonorgestrel (MIRENA) 20 MCG/24HR IUD 1 each by Intrauterine route once.    Historical Provider, MD  lisinopril (PRINIVIL,ZESTRIL) 2.5 MG tablet Take 2.5 mg by mouth daily.    Historical Provider, MD  promethazine (PHENERGAN) 25 MG tablet Take 1 tablet (25 mg total) by mouth every 6 (six) hours as needed for nausea or vomiting. 12/09/14   Kristen N Ward, DO  sertraline (ZOLOFT) 20 MG/ML concentrated solution Take 25 mg by mouth daily.    Historical Provider, MD  TRAZODONE HCL PO Take by mouth.    Historical Provider, MD  UNKNOWN TO PATIENT Muscle relaxant    Historical Provider, MD  zolpidem (AMBIEN) 10 MG tablet Take 10 mg by mouth at bedtime as needed.    Historical Provider, MD   BP 121/92 mmHg  Pulse 110  Temp(Src) 98.7 F (37.1 C) (Oral)  Resp  16  Ht 5\' 9"  (1.753 m)  Wt 124 lb (56.246 kg)  BMI 18.30 kg/m2  SpO2 98% Physical Exam Nursing notes and Vital Signs reviewed. Appearance:  Patient appears stated age, and in no acute distress Eyes:  Pupils are equal, round, and reactive to light and accomodation.  Extraocular movement is intact.  Conjunctivae are not inflamed  Ears:  Canals normal.  Tympanic membranes normal.  Nose:  Mildly congested turbinates.  No sinus tenderness.    Pharynx:  Uvula is erythematous  Neck:  Supple.  Tender shotty posterior nodes are palpated bilaterally  Lungs:   There are faint expiratory wheezes in left chest.  Breath sounds are equal.  Heart:  Regular rate and rhythm  without murmurs, rubs, or gallops.  Abdomen:  Nontender without masses or hepatosplenomegaly.  Bowel sounds are present.  No CVA or flank tenderness.  Extremities:  No edema.  No calf tenderness Skin:  No rash present.    ED Course  Procedures  None    Labs Reviewed  POCT CBC W AUTO DIFF ([facility]):  WBC 13.1; LY 22.4; MO 5.7; GR 71.9; Hgb 16.6; Platelets 343     Imaging Review Dg Chest 2 View  03/10/2015   CLINICAL DATA:  Cough  EXAM: CHEST  2 VIEW  COMPARISON:  None.  FINDINGS: The heart size and mediastinal contours are within normal limits. Both lungs are clear. The visualized skeletal structures are unremarkable.  IMPRESSION: No active cardiopulmonary disease.   Electronically Signed   By: Marlan Palauharles  Clark M.D.   On: 03/10/2015 15:32     MDM   1. Persistent cough   2. Acute streptococcal pharyngitis; persistent    Begin Clindamycin 300mg  TID for 10 days.  May continue Hydromet at bedtime. Take plain guaifenesin (1200mg  extended release tabs such as Mucinex) twice daily, with plenty of water, for cough and congestion.  Get adequate rest.   May use Afrin nasal spray (or generic oxymetazoline) twice daily for about 5 days.  Also recommend using saline nasal spray several times daily and saline nasal irrigation (AYR is a common brand).   Try warm salt water gargles for sore throat.  Stop all antihistamines for now, and other non-prescription cough/cold preparations. Stop Keflex Follow-up with family doctor if not improving about one week.     Lattie HawStephen A Beese, MD 03/13/15 1248

## 2015-03-15 ENCOUNTER — Emergency Department
Admission: EM | Admit: 2015-03-15 | Discharge: 2015-03-15 | Discharge status: Absent without leave | Payer: Self-pay | Source: Home / Self Care

## 2015-03-18 ENCOUNTER — Telehealth: Payer: Self-pay | Admitting: Emergency Medicine

## 2015-04-21 ENCOUNTER — Ambulatory Visit (INDEPENDENT_AMBULATORY_CARE_PROVIDER_SITE_OTHER): Payer: 59 | Admitting: Physician Assistant

## 2015-04-21 ENCOUNTER — Encounter (HOSPITAL_COMMUNITY): Payer: Self-pay | Admitting: Physician Assistant

## 2015-04-21 VITALS — BP 120/80 | HR 74 | Ht 68.0 in | Wt 123.0 lb

## 2015-04-21 DIAGNOSIS — F17200 Nicotine dependence, unspecified, uncomplicated: Secondary | ICD-10-CM | POA: Insufficient documentation

## 2015-04-21 DIAGNOSIS — G4700 Insomnia, unspecified: Secondary | ICD-10-CM | POA: Diagnosis not present

## 2015-04-21 DIAGNOSIS — F411 Generalized anxiety disorder: Secondary | ICD-10-CM

## 2015-04-21 DIAGNOSIS — L732 Hidradenitis suppurativa: Secondary | ICD-10-CM | POA: Insufficient documentation

## 2015-04-21 DIAGNOSIS — G43909 Migraine, unspecified, not intractable, without status migrainosus: Secondary | ICD-10-CM

## 2015-04-21 DIAGNOSIS — L659 Nonscarring hair loss, unspecified: Secondary | ICD-10-CM

## 2015-04-21 DIAGNOSIS — Z79899 Other long term (current) drug therapy: Secondary | ICD-10-CM | POA: Insufficient documentation

## 2015-04-21 DIAGNOSIS — K589 Irritable bowel syndrome without diarrhea: Secondary | ICD-10-CM | POA: Insufficient documentation

## 2015-04-21 HISTORY — DX: Migraine, unspecified, not intractable, without status migrainosus: G43.909

## 2015-04-21 LAB — THYROID PANEL WITH TSH
FREE THYROXINE INDEX: 2.1 (ref 1.4–3.8)
T3 Uptake: 29 % (ref 22–35)
T4, Total: 7.1 ug/dL (ref 4.5–12.0)
TSH: 1.174 u[IU]/mL (ref 0.350–4.500)

## 2015-04-21 MED ORDER — HYDROXYZINE HCL 25 MG PO TABS: 25.0000 mg | ORAL_TABLET | Freq: Three times a day (TID) | ORAL | Status: AC | PRN

## 2015-04-21 MED ORDER — BUSPIRONE HCL 30 MG PO TABS: 30.0000 mg | ORAL_TABLET | Freq: Every day | ORAL | Status: DC

## 2015-04-21 MED ORDER — TRAZODONE HCL 50 MG PO TABS: 75.0000 mg | ORAL_TABLET | Freq: Every day | ORAL | Status: DC

## 2015-04-21 NOTE — Addendum Note (Signed)
Addended by: Verne SpurrMASHBURN, Lari Linson T on: 04/21/2015 10:55 AM   Modules accepted: Orders

## 2015-04-21 NOTE — Progress Notes (Signed)
Psychiatric Assessment Adult  Patient Identification:  Cathy Wise Date of Evaluation:  04/21/2015 Chief Complaint: Anxiety  History of Chief Complaint:   Chief Complaint  Patient presents with  . Establish Care  . Anxiety    HPI Comments: Patient is a 34 year old DWF who presents on referral from PCP [name] for treatment of anxiety with panic attacks and insomnia.  Patient has never seen a psychiatrist in the past. No history of Suicide attempt, or psychiatric hospitalization.   Anxiety Presents for initial visit. Onset was more than 5 years ago. Symptoms include decreased concentration, excessive worry, insomnia, irritability, nausea, nervous/anxious behavior, palpitations and panic. Patient reports no chest pain, compulsions, confusion, depressed mood, dizziness, dry mouth, feeling of choking, hyperventilation, impotence, malaise, muscle tension, obsessions, restlessness, shortness of breath or suicidal ideas. Symptoms occur most days. The most recent episode lasted 0 minutes. The severity of symptoms is severe. The symptoms are aggravated by work stress. The quality of sleep is fair. Nighttime awakenings: several.   Risk factors include alcohol intake, family history, a major life event and recent illness. Her past medical history is significant for anxiety/panic attacks and depression. There is no history of anemia, arrhythmia, asthma, bipolar disorder, CAD, CHF, chronic lung disease, fibromyalgia, hyperthyroidism or suicide attempts. Past treatments include benzodiazephines, non-benzodiazephine anxiolytics and SSRIs. The treatment provided moderate relief. Compliance with prior treatments has been good. Compliance with medications is 76-100%. Treatment side effects: Hair loss with SSRIs.   Review of Systems  Constitutional: Positive for irritability.  Respiratory: Negative for shortness of breath.   Cardiovascular: Positive for palpitations. Negative for chest pain.   Gastrointestinal: Positive for nausea.  Genitourinary: Negative for impotence.  Neurological: Negative for dizziness.  Psychiatric/Behavioral: Positive for decreased concentration. Negative for suicidal ideas and confusion. The patient is nervous/anxious and has insomnia.    Physical Exam  Depressive Symptoms: insomnia, difficulty concentrating, impaired memory, anxiety, panic attacks, disturbed sleep,  (Hypo) Manic Symptoms:   Elevated Mood:  No Irritable Mood:  Yes Grandiosity:  No Distractibility:  Yes Labiality of Mood:  No Delusions:  No Hallucinations:  No Impulsivity:  No Sexually Inappropriate Behavior:  No Financial Extravagance:  No Flight of Ideas:  No  Anxiety Symptoms: Excessive Worry:  Yes Panic Symptoms:  Yes Agoraphobia:  No Obsessive Compulsive: No  Symptoms:  Specific Phobias:  none Social Anxiety:  No  Psychotic Symptoms:  Hallucinations: No None Delusions:  No Paranoia:  No   Ideas of Reference:  No  PTSD Symptoms: Ever had a traumatic exposure:  No Had a traumatic exposure in the last month:   Re-experiencing:   Hypervigilance:   Hyperarousal:   Avoidance:    Traumatic Brain Injury: No   Past Psychiatric History: Diagnosis: anxiety  Hospitalizations: none  Outpatient Care: none  Substance Abuse Care: none  Self-Mutilation: none  Suicidal Attempts: none  Violent Behaviors: none   Past Medical History:   Past Medical History  Diagnosis Date  . Depression   . Insomnia   . Migraine   . Migraines 04/21/2015  . Anxiety    History of Loss of Consciousness:  No Seizure History:  No Cardiac History:  No Allergies:   Allergies  Allergen Reactions  . Penicillins Hives    Hives, has taken Keflex in 2015 without problems   Current Medications:  Current Outpatient Prescriptions  Medication Sig Dispense Refill  . busPIRone (BUSPAR) 7.5 MG tablet TAKE 1 TABLET BY MOUTH TWICE DAILY    . clonazePAM (KLONOPIN)  0.5 MG tablet Take  0.5 mg by mouth 2 (two) times daily as needed for anxiety.    . cyclobenzaprine (FLEXERIL) 10 MG tablet Take 10 mg by mouth.    . eletriptan (RELPAX) 20 MG tablet Take 20 mg by mouth as needed for migraine or headache. One tablet by mouth at onset of headache. May repeat in 2 hours if headache persists or recurs.    Marland Kitchen. levonorgestrel (MIRENA) 20 MCG/24HR IUD 1 each by Intrauterine route once.    Marland Kitchen. lisinopril (PRINIVIL,ZESTRIL) 2.5 MG tablet Take 2.5 mg by mouth daily.    . promethazine (PHENERGAN) 25 MG tablet Take 1 tablet (25 mg total) by mouth every 6 (six) hours as needed for nausea or vomiting. 15 tablet 0  . TRAZODONE HCL PO Take by mouth.    Marland Kitchen. UNKNOWN TO PATIENT Muscle relaxant    . butalbital-acetaminophen-caffeine (FIORICET) 50-325-40 MG per tablet Take 1-2 tablets by mouth every 6 (six) hours as needed for headache. (Patient not taking: Reported on 04/21/2015) 20 tablet 0  . clindamycin (CLEOCIN) 300 MG capsule Take 1 capsule (300 mg total) by mouth 3 (three) times daily. (Patient not taking: Reported on 04/21/2015) 30 capsule 0   No current facility-administered medications for this visit.    Previous Psychotropic Medications:  Medication Dose   Lexapro    zoloft   Buspar    Celexa             Substance Abuse History in the last 12 months:  2 alcoholic beverages daily, no sequela 1 ppd smoker Medical Consequences of Substance Abuse:   Legal Consequences of Substance Abuse:   Family Consequences of Substance Abuse:   Blackouts:  No DT's:  No Withdrawal Symptoms:  No None  Social History: [place] Birth[place] Family Members: parents, 1 sibling Marital Status:  Divorced Children: 0Sons:   Daughters: - Relationships: - Education:  college Educational Problems/Performance: none Religious Beliefs/Practices: agnostic History of Abuse: none Teacher, musicccupational Experiences; Military History:  None. Legal History: DUI at  [age] Hobbies/Interests: none  Family History:   Family History  Problem Relation Age of Onset  . Hyperlipidemia Mother   . Hypertension Mother   . Anxiety disorder Mother   . Depression Mother   . Heart attack Father   . Anxiety disorder Father     Mental Status Examination/Evaluation: Objective:  Appearance: Well Groomed  Eye Contact::  Good  Speech:  Clear and Coherent and Normal Rate  Volume:  Normal  Mood:  Euthymic and little anxious  Affect:  congruent  Thought Process:  Coherent, Goal Directed, Intact, Linear and Logical  Orientation:  NA  Thought Content:  WDL  Suicidal Thoughts:  No  Homicidal Thoughts:  No  Judgement:  Good  Insight:  Good  Psychomotor Activity:  Normal  Akathisia:  No  Handed:  Right  AIMS (if indicated):    Assets:  Communication Skills Desire for Improvement Financial Resources/Insurance Housing Leisure Time Physical Health Resilience Social Support Talents/Skills Transportation    Laboratory/X-Ray Psychological Evaluation(s)        Assessment:    AXIS I  GAD with insomnia,  AXIS II  deferred  AXIS III Past Medical History  Diagnosis Date  . Depression   . Insomnia   . Migraine   . Migraines 04/21/2015  . Anxiety      AXIS IV other psychosocial or environmental problems  AXIS V 61-70 mild symptoms   Treatment Plan/Recommendations:  Plan of  Care: GAD  Laboratory:  None at this time  Psychotherapy: recommended  Medications: Buspar  Routine PRN Medications:  Yes  Consultations: if needed  Safety Concerns:  None at this time  Other:      Dakiya Puopolo, PA-C 5/11/20169:57 AM

## 2015-04-21 NOTE — Patient Instructions (Signed)
1. Take all of your medications as discussed with your provider. (Please check your AVS, for the list.) 2. Call this office for any questions or problems. 3. Be sure to get plenty of rest and try for 7-9 hours of quality sleep each night. 4. Try to get regular exercise, at least 15-30 minutes each day.  A good walk will help tremendously! 5. Remember to do your mindfulness each day, breath deeply in and out, while having quiet reflection, prayer, meditation, or positive visualization. Unplug and turn off all electronic devices each day for your own personal time without interruption. This works! There are studies to back this up! 6. Be sure to take your B complex and Vitamin D3 each day. This will improve your overall wellbeing and boost your immune system as well. 7. Try to eat a nutritious healthy diet and avoid excessive alcohol and ALL tobacco products. 8. Be sure to keep all of your appointments with your outpatient therapist. If you do not have one, our office will be happy to assist you with this. 9. Be sure to keep your next follow up appointment in 2 weeks. 

## 2015-05-05 ENCOUNTER — Ambulatory Visit (HOSPITAL_COMMUNITY): Payer: Self-pay | Admitting: Physician Assistant

## 2015-05-11 ENCOUNTER — Ambulatory Visit (INDEPENDENT_AMBULATORY_CARE_PROVIDER_SITE_OTHER): Payer: 59 | Admitting: Physician Assistant

## 2015-05-11 ENCOUNTER — Encounter (HOSPITAL_COMMUNITY): Payer: Self-pay | Admitting: Physician Assistant

## 2015-05-11 DIAGNOSIS — F411 Generalized anxiety disorder: Secondary | ICD-10-CM

## 2015-05-11 MED ORDER — BUSPIRONE HCL 10 MG PO TABS: ORAL_TABLET | ORAL | Status: AC

## 2015-05-11 NOTE — Progress Notes (Signed)
[facility]  05/11/2015 8:52 AM Cathy Wise  MRN:  [id number] Subjective: Patient is in to follow up on her anxiety. She is taking the Buspar without difficulty, but was unable to take a higher dose of Trazodone. She is getting about 6 hours of sleep. She notes that she feels a little better and notes a reduction of her anxiety symptoms including reduced feelings of anxiety, less irritability, less insomnia, less worry. Principal Problem: GAD Diagnosis:   Patient Active Problem List   Diagnosis Date Noted  . Migraines [G43.909] 04/21/2015  . Hidradenitis [L73.2] 04/21/2015  . 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 Compulsive tobacco user syndrome [F17.200] 04/21/2015  . Polypharmacy [Z79.899] 04/21/2015  . Lower urinary tract infection [N39.0] 06/23/2013  . Insomnia [G47.00]   . ANXIETY [F41.1] 03/30/2011  . HYPERTENSION [I10] 03/30/2011   Total Time spent with patient: 30 minutes   Past Medical History:  Past Medical History  Diagnosis Date  . Depression   . Insomnia   . Migraine   . Migraines 04/21/2015  . Anxiety     Past Surgical History  Procedure Laterality Date  . Incise and drain abcess      patient notes at least 50 I & D surgeries   Family History:  Family History  Problem Relation Age of Onset  . Hyperlipidemia Mother   . Hypertension Mother   . Anxiety disorder Mother   . Depression Mother   . Heart attack Father   . Anxiety disorder Father    Social History:  History  Alcohol Use  . 0.0 oz/week  . 0 Standard drinks or equivalent per week    Comment: 2 drinks per night     History  Drug Use No    History   Social History  . Marital Status: Legally Separated    Spouse Name: N/A  . Number of Children: N/A  . Years of Education: N/A   Social History Main Topics  . Smoking status: Former Smoker -- 1.00 packs/day for 15 years    Types: Cigarettes    Quit date: 09/21/2012  . Smokeless tobacco: Never Used  . Alcohol Use: 0.0 oz/week    0 Standard drinks or equivalent per week     Comment: 2 drinks per night  . Drug Use: No  . Sexual Activity:    Partners: Male   Other Topics Concern  . Not on file   Social History Narrative   Additional History:    Sleep: Fair  Appetite:  Fair   Assessment:   Musculoskeletal: Strength & Muscle Tone: within normal limits Gait & Station: normal Patient leans: normal   Psychiatric Specialty Exam: Physical Exam  Review of Systems  All other systems reviewed and are negative.   There were no vitals taken for this visit.There is no weight on file to calculate BMI.  General Appearance: Meticulous, Neat and Well Groomed  Eye Contact::  Good  Speech:  Clear and Coherent  Volume:  Normal  Mood:  Dysphoric  Affect:  Congruent  Thought Process:  Coherent, Goal Directed, Linear and Logical  Orientation:  Full (Time, Place, and Person)  Thought Content:  WDL  Suicidal Thoughts:  No  Homicidal Thoughts:  No  Memory:  Immediate;   Good Recent;   Good Remote;   Good  Judgement:  Good  Insight:  Good  Psychomotor Activity:  Normal  Concentration:  Good  Recall:  Good  Fund of Knowledge:Good  Language: Good  Akathisia:  No  Handed:  Right  AIMS (if indicated):     Assets:  Communication Skills Desire for Improvement Financial Resources/Insurance Housing Leisure Time Physical Health Resilience Social Support Talents/Skills Transportation Vocational/Educational  ADL's:  Intact  Cognition: WNL  Sleep:  About 6 hours     Current Medications: Current Outpatient Prescriptions  Medication Sig Dispense Refill  . busPIRone (BUSPAR) 30 MG tablet Take 1 tablet (30 mg total) by mouth daily. 30 tablet 0  . clonazePAM (KLONOPIN) 0.5 MG tablet Take 0.5 mg by mouth 2 (two) times daily as needed for anxiety.    . cyclobenzaprine (FLEXERIL) 10 MG tablet Take 10 mg by mouth.    . levonorgestrel (MIRENA) 20 MCG/24HR IUD 1 each by Intrauterine route once.    Marland Kitchen lisinopril  (PRINIVIL,ZESTRIL) 2.5 MG tablet Take 2.5 mg by mouth daily.    . minoxidil (ROGAINE) 2 % external solution Apply topically daily.    . traZODone (DESYREL) 50 MG tablet Take 1.5 tablets (75 mg total) by mouth at bedtime. 45 tablet 0  . butalbital-acetaminophen-caffeine (FIORICET) 50-325-40 MG per tablet Take 1-2 tablets by mouth every 6 (six) hours as needed for headache. (Patient not taking: Reported on 04/21/2015) 20 tablet 0  . clindamycin (CLEOCIN) 300 MG capsule Take 1 capsule (300 mg total) by mouth 3 (three) times daily. (Patient not taking: Reported on 04/21/2015) 30 capsule 0  . eletriptan (RELPAX) 20 MG tablet Take 20 mg by mouth as needed for migraine or headache. One tablet by mouth at onset of headache. May repeat in 2 hours if headache persists or recurs.    . hydrOXYzine (ATARAX/VISTARIL) 25 MG tablet Take 1 tablet (25 mg total) by mouth 3 (three) times daily as needed. (Patient not taking: Reported on 05/11/2015) 30 tablet 0  . promethazine (PHENERGAN) 25 MG tablet Take 1 tablet (25 mg total) by mouth every 6 (six) hours as needed for nausea or vomiting. (Patient not taking: Reported on 05/11/2015) 15 tablet 0  . UNKNOWN TO PATIENT Muscle relaxant     No current facility-administered medications for this visit.    Lab Results:  Results for Cathy Wise (MRN [id number]) as of 05/11/2015 09:02  Ref. Range 06/02/2009 15:15 06/02/2009 15:52 04/21/2015 11:10  TSH Latest Ref Range: 0.350-4.500 uIU/mL   1.174  T4, Total Latest Ref Range: 4.5-12.0 ug/dL   7.1  Free Thyroxine Index Latest Ref Range: 1.4-3.8    2.1    Physical Findings: AIMS:  CIWA:   COWS:    Treatment Plan Summary: 1. Will increase Buspar to  in AM x 1 week then increase to  if no side effects. 2. Will continue Buspar  at hs. 3. Patient will schedule time for herself during the week and make an extra effort to put it on the calendar. Yoga, Mindfulness, and exercise. 4. TSH reviewed and is normal.  Medical  Decision Making:  Established Problem, Stable/Improving (1), Review or order clinical lab tests (1), Review or order medicine tests (1) and Review of New Medication or Change in Dosage (2)  1. Follow up in 6 weeks.  Rona Ravens. Andrika Peraza RPAC 9:13 AM 05/11/2015

## 2015-05-11 NOTE — Patient Instructions (Signed)
1. Take all of your medications as discussed with your provider. (Please check your AVS, for the list.) 2. Call this office for any questions or problems. 3. Be sure to get plenty of rest and try for 7-9 hours of quality sleep each night. 4. Try to get regular exercise, at least 15-30 minutes each day.  A good walk will help tremendously! 5. Remember to do your mindfulness each day, breath deeply in and out, while having quiet reflection, prayer, meditation, or positive visualization. Unplug and turn off all electronic devices each day for your own personal time without interruption. This works! There are studies to back this up! 6. Be sure to take your B complex and Vitamin D3 each day. This will improve your overall wellbeing and boost your immune system as well. 7. Try to eat a nutritious healthy diet and avoid excessive alcohol and ALL tobacco products. 8. Be sure to keep all of your appointments with your outpatient therapist. If you do not have one, our office will be happy to assist you with this. 9. Be sure to keep your next follow up appointment in 6 weeks.

## 2015-05-15 ENCOUNTER — Other Ambulatory Visit (HOSPITAL_COMMUNITY): Payer: Self-pay | Admitting: Physician Assistant

## 2015-05-18 ENCOUNTER — Telehealth (HOSPITAL_COMMUNITY): Payer: Self-pay

## 2015-05-18 NOTE — Telephone Encounter (Signed)
PT would like to speak to you about her medication.

## 2015-06-15 NOTE — Telephone Encounter (Signed)
Pt called for a refill for Buspar 30mg  and Trazodone 50mg . Per Verne SpurrNeil Mashburn, PA-C, pt is authorized for a refill for Buspar 30mg , Qty 30 and Trazodone 50mg , qty 45. Prescription was sent to [facility] ([address]) Pt has a f/u appt on 8/18. Pt verbalizes understanding.

## 2015-07-22 ENCOUNTER — Other Ambulatory Visit (HOSPITAL_COMMUNITY): Payer: Self-pay | Admitting: Physician Assistant

## 2015-07-29 ENCOUNTER — Ambulatory Visit (HOSPITAL_COMMUNITY): Payer: Self-pay | Admitting: Medical

## 2015-08-23 ENCOUNTER — Telehealth (HOSPITAL_COMMUNITY): Payer: Self-pay | Admitting: *Deleted

## 2015-08-23 MED ORDER — BUSPIRONE HCL 30 MG PO TABS: 30.0000 mg | ORAL_TABLET | Freq: Every day | ORAL | Status: AC

## 2015-08-23 NOTE — Telephone Encounter (Signed)
Pt called for a refill for Buspar . Per Maryjean Morn, pt is authorized for Buspar , Qty 30. Prescription was sent to pharmacy.  Pt has a f/u appt on 09/09/15. Called and informed pt of prescription. Pt verbalizes understanding.

## 2015-08-23 NOTE — Telephone Encounter (Signed)
Thanks

## 2015-09-09 ENCOUNTER — Ambulatory Visit (HOSPITAL_COMMUNITY): Payer: 59 | Admitting: Medical

## 2017-03-28 IMAGING — CR DG CHEST 2V
2 series · 2 of 2 positions shown · non-contrast
Comparison: None.

CLINICAL DATA: Cough

EXAM:
CHEST  2 VIEW

[chest pa]
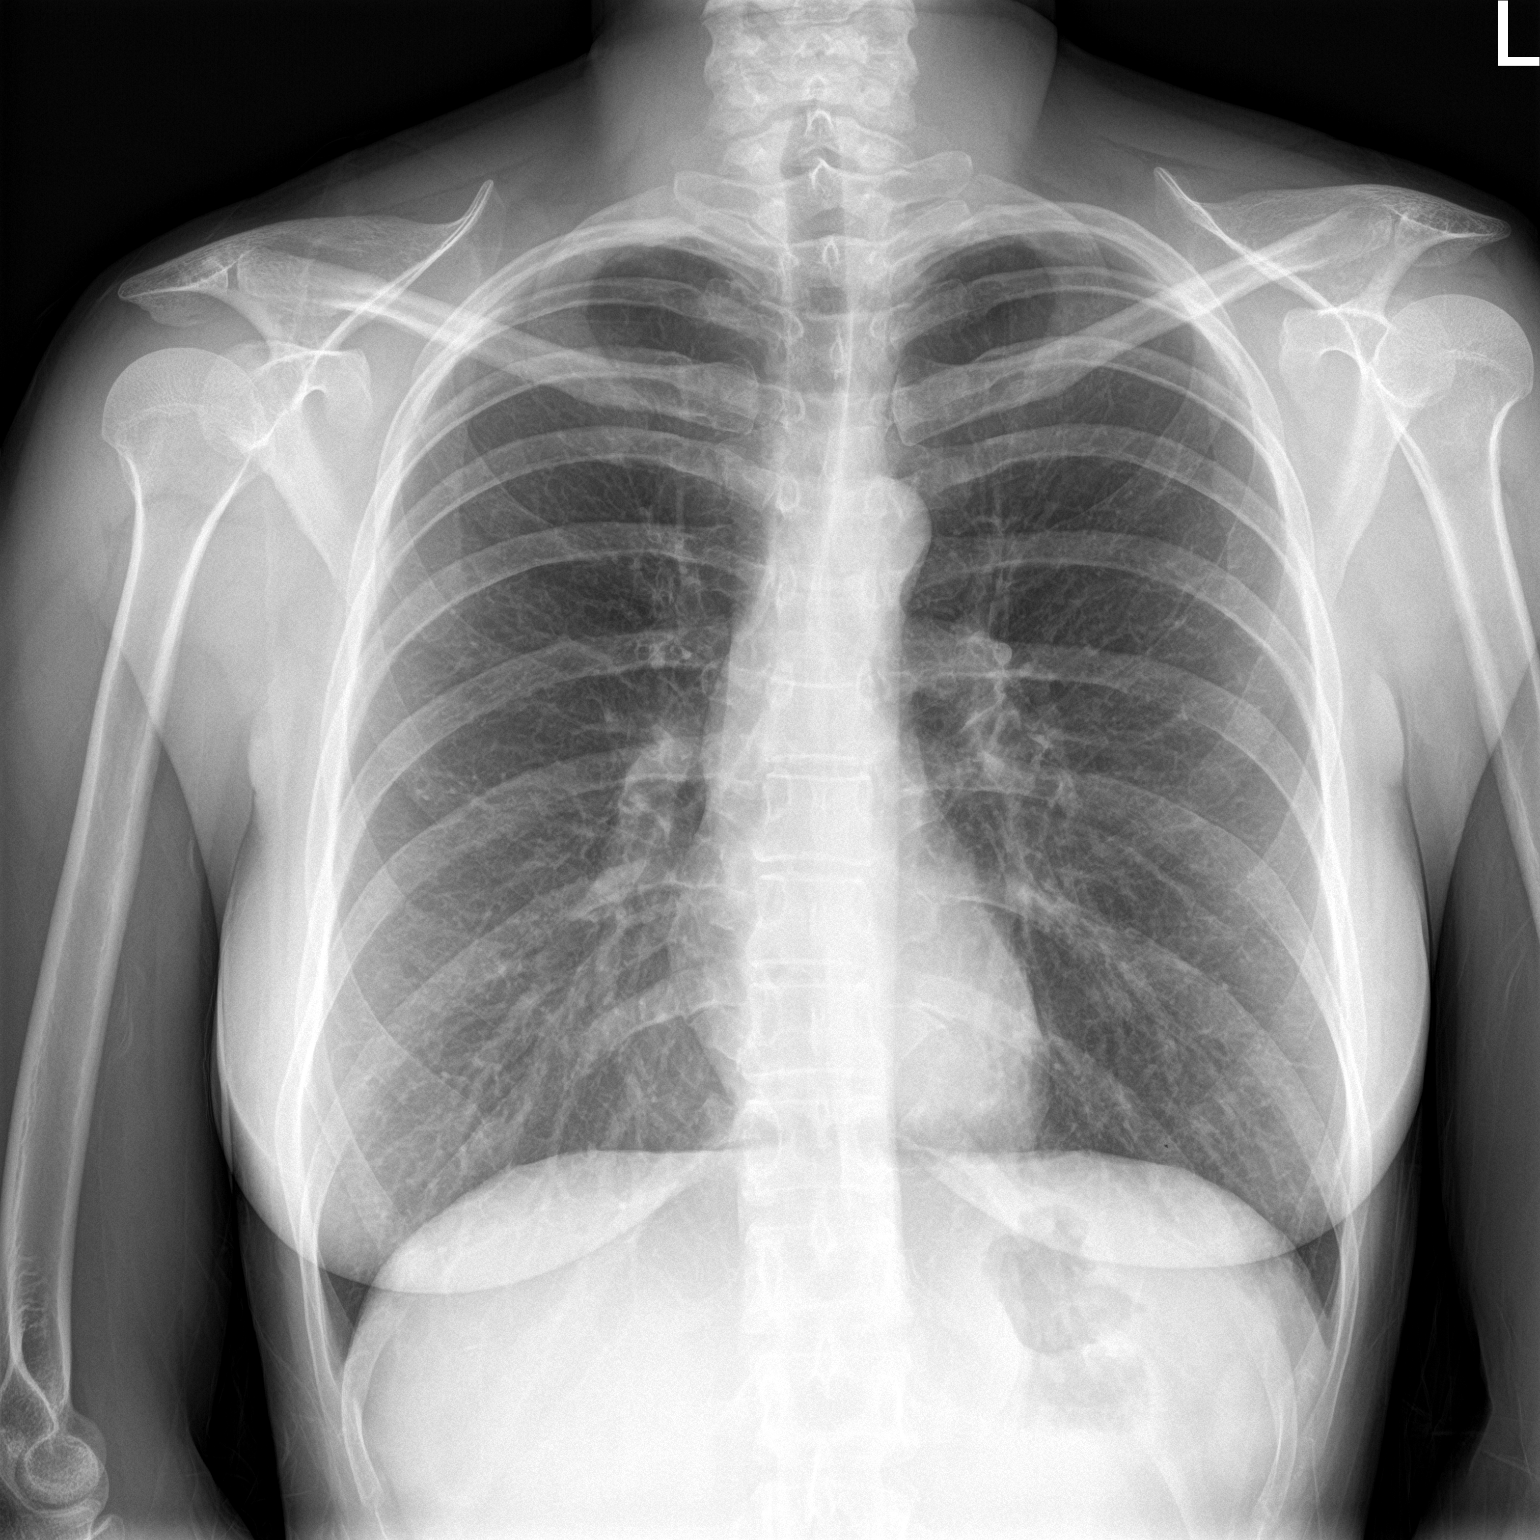

[chest lat]
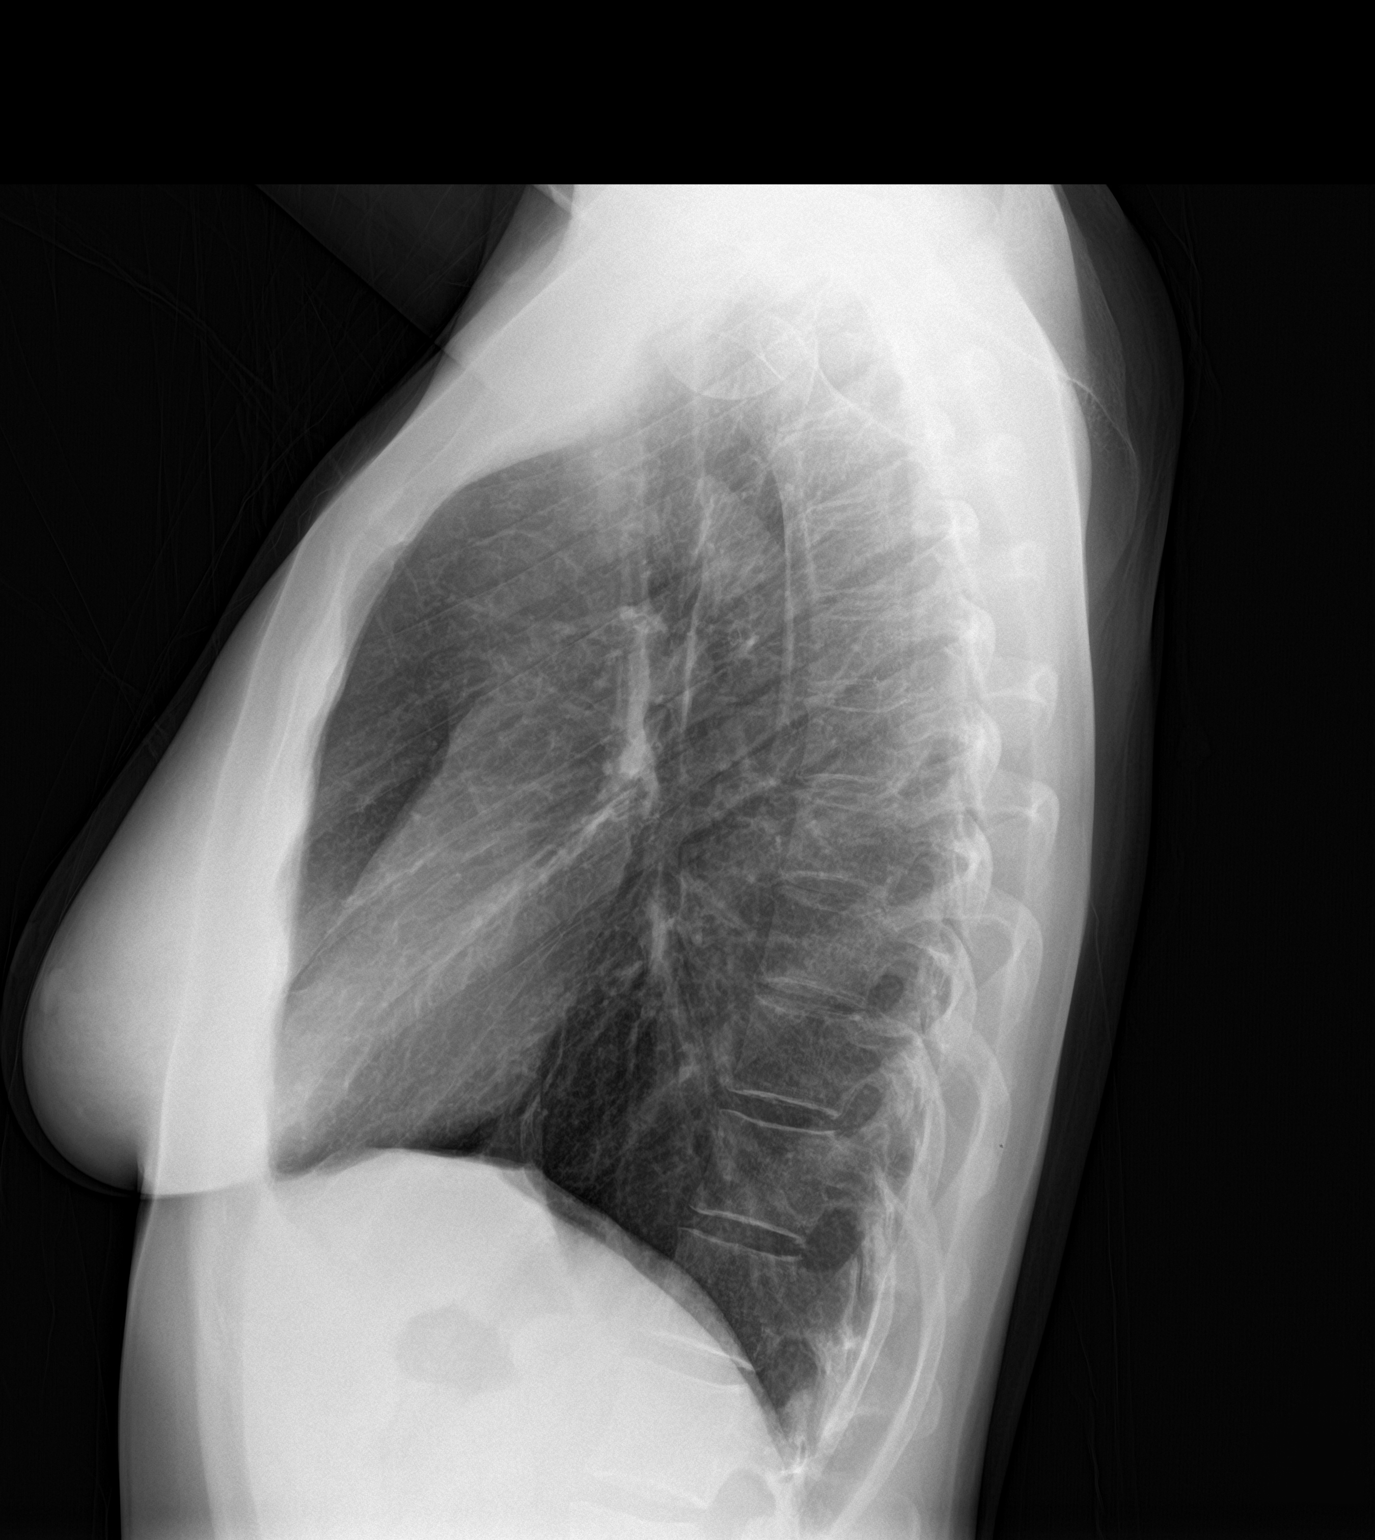

[2 of 2 positions shown; findings below may reference images not displayed]

FINDINGS: The heart size and mediastinal contours are within normal limits.
Both lungs are clear. The visualized skeletal structures are
unremarkable.
IMPRESSION: No active cardiopulmonary disease.
